# Patient Record
Sex: Female | Born: 1960 | Race: White | Hispanic: No | Marital: Single | Smoking: Never smoker
Health system: Southern US, Community
[De-identification: ages and names within clinical notes are randomized; demographics above are authoritative.]

## PROBLEM LIST (undated history)

## (undated) DIAGNOSIS — O99283 Endocrine, nutritional and metabolic diseases complicating pregnancy, third trimester: Secondary | ICD-10-CM

## (undated) DIAGNOSIS — N3090 Cystitis, unspecified without hematuria: Secondary | ICD-10-CM

## (undated) DIAGNOSIS — D529 Folate deficiency anemia, unspecified: Secondary | ICD-10-CM

## (undated) DIAGNOSIS — C73 Malignant neoplasm of thyroid gland: Secondary | ICD-10-CM

## (undated) DIAGNOSIS — C801 Malignant (primary) neoplasm, unspecified: Secondary | ICD-10-CM

## (undated) DIAGNOSIS — K219 Gastro-esophageal reflux disease without esophagitis: Secondary | ICD-10-CM

## (undated) DIAGNOSIS — E039 Hypothyroidism, unspecified: Secondary | ICD-10-CM

## (undated) HISTORY — DX: Cystitis, unspecified without hematuria: N30.90

## (undated) HISTORY — DX: Gastro-esophageal reflux disease without esophagitis: K21.9

## (undated) HISTORY — DX: Folate deficiency anemia, unspecified: D52.9

## (undated) HISTORY — DX: Hypocalcemia: E83.51

## (undated) HISTORY — PX: THYROIDECTOMY: SHX17

## (undated) NOTE — Telephone Encounter (Signed)
 Formatting of this note might be different from the original. Please refer to result note dated 03/24/15 Electronically signed by Rash, [name] A, LPN at 90/84/7983 10:53 AM EDT

## (undated) NOTE — Telephone Encounter (Signed)
 Formatting of this note might be different from the original. Patient left VM stating that she was returning [name]'s call about her lab results. She also stated that she thought Dr. Celena was going to put her on some blood pressure medicine, but that was not called in to her pharmacy. [name] CB# is [phone]. Electronically signed by Candiss Price T at 03/24/2015 10:39 AM EDT

---

## 1996-07-09 DIAGNOSIS — C73 Malignant neoplasm of thyroid gland: Secondary | ICD-10-CM

## 1996-07-09 HISTORY — DX: Malignant neoplasm of thyroid gland: C73

## 2015-01-19 ENCOUNTER — Encounter: Payer: Self-pay | Admitting: Emergency Medicine

## 2015-01-19 ENCOUNTER — Inpatient Hospital Stay
Admit: 2015-01-19 | Discharge: 2015-01-19 | Disposition: A | Discharge status: Home / Self Care | Payer: Self-pay | Attending: Radiation Oncology | Admitting: Radiation Oncology

## 2015-01-19 ENCOUNTER — Emergency Department: Payer: BLUE CROSS/BLUE SHIELD

## 2015-01-19 ENCOUNTER — Inpatient Hospital Stay
Admission: EM | Admit: 2015-01-19 | Discharge: 2015-01-27 | DRG: 987 | Disposition: A | Discharge status: Skilled Nursing Facility | Payer: BLUE CROSS/BLUE SHIELD | Attending: Internal Medicine | Admitting: Internal Medicine

## 2015-01-19 DIAGNOSIS — D638 Anemia in other chronic diseases classified elsewhere: Secondary | ICD-10-CM | POA: Diagnosis present

## 2015-01-19 DIAGNOSIS — Z681 Body mass index (BMI) 19 or less, adult: Secondary | ICD-10-CM | POA: Diagnosis not present

## 2015-01-19 DIAGNOSIS — Z79899 Other long term (current) drug therapy: Secondary | ICD-10-CM

## 2015-01-19 DIAGNOSIS — N63 Unspecified lump in breast: Secondary | ICD-10-CM | POA: Diagnosis not present

## 2015-01-19 DIAGNOSIS — R531 Weakness: Secondary | ICD-10-CM | POA: Diagnosis not present

## 2015-01-19 DIAGNOSIS — N3090 Cystitis, unspecified without hematuria: Secondary | ICD-10-CM

## 2015-01-19 DIAGNOSIS — C7951 Secondary malignant neoplasm of bone: Secondary | ICD-10-CM | POA: Diagnosis present

## 2015-01-19 DIAGNOSIS — E538 Deficiency of other specified B group vitamins: Secondary | ICD-10-CM | POA: Diagnosis present

## 2015-01-19 DIAGNOSIS — E039 Hypothyroidism, unspecified: Secondary | ICD-10-CM

## 2015-01-19 DIAGNOSIS — E43 Unspecified severe protein-calorie malnutrition: Secondary | ICD-10-CM | POA: Diagnosis present

## 2015-01-19 DIAGNOSIS — C73 Malignant neoplasm of thyroid gland: Secondary | ICD-10-CM | POA: Diagnosis present

## 2015-01-19 DIAGNOSIS — Z510 Encounter for antineoplastic radiation therapy: Secondary | ICD-10-CM | POA: Diagnosis not present

## 2015-01-19 DIAGNOSIS — C50919 Malignant neoplasm of unspecified site of unspecified female breast: Principal | ICD-10-CM | POA: Diagnosis present

## 2015-01-19 DIAGNOSIS — C50911 Malignant neoplasm of unspecified site of right female breast: Secondary | ICD-10-CM | POA: Diagnosis not present

## 2015-01-19 DIAGNOSIS — M899 Disorder of bone, unspecified: Secondary | ICD-10-CM

## 2015-01-19 DIAGNOSIS — Z807 Family history of other malignant neoplasms of lymphoid, hematopoietic and related tissues: Secondary | ICD-10-CM | POA: Diagnosis not present

## 2015-01-19 DIAGNOSIS — N6310 Unspecified lump in the right breast, unspecified quadrant: Secondary | ICD-10-CM | POA: Diagnosis present

## 2015-01-19 DIAGNOSIS — R52 Pain, unspecified: Secondary | ICD-10-CM | POA: Diagnosis not present

## 2015-01-19 DIAGNOSIS — M549 Dorsalgia, unspecified: Secondary | ICD-10-CM | POA: Diagnosis not present

## 2015-01-19 DIAGNOSIS — M898X5 Other specified disorders of bone, thigh: Secondary | ICD-10-CM | POA: Diagnosis present

## 2015-01-19 DIAGNOSIS — G9529 Other cord compression: Secondary | ICD-10-CM | POA: Diagnosis not present

## 2015-01-19 DIAGNOSIS — C9000 Multiple myeloma not having achieved remission: Secondary | ICD-10-CM | POA: Diagnosis present

## 2015-01-19 DIAGNOSIS — N390 Urinary tract infection, site not specified: Secondary | ICD-10-CM | POA: Diagnosis present

## 2015-01-19 HISTORY — DX: Hypothyroidism, unspecified: E03.9

## 2015-01-19 HISTORY — DX: Malignant neoplasm of thyroid gland: C73

## 2015-01-19 HISTORY — DX: Endocrine, nutritional and metabolic diseases complicating pregnancy, third trimester: O99.283

## 2015-01-19 HISTORY — DX: Malignant (primary) neoplasm, unspecified: C80.1

## 2015-01-19 LAB — COMPREHENSIVE METABOLIC PANEL
ALT: 33 U/L (ref 14–54)
AST: 59 U/L — AB (ref 15–41)
Albumin: 2.5 g/dL — ABNORMAL LOW (ref 3.5–5.0)
Alkaline Phosphatase: 1593 U/L — ABNORMAL HIGH (ref 38–126)
Anion gap: 10 (ref 5–15)
BUN: 13 mg/dL (ref 6–20)
CO2: 16 mmol/L — ABNORMAL LOW (ref 22–32)
CREATININE: 0.75 mg/dL (ref 0.44–1.00)
Calcium: 8.1 mg/dL — ABNORMAL LOW (ref 8.9–10.3)
Chloride: 110 mmol/L (ref 101–111)
GFR calc Af Amer: >60 mL/min (ref >60)
GFR calc non Af Amer: >60 mL/min (ref >60)
GLUCOSE: 204 mg/dL — AB (ref 65–99)
POTASSIUM: 3.5 mmol/L (ref 3.5–5.1)
SODIUM: 136 mmol/L (ref 135–145)
TOTAL PROTEIN: 6.1 g/dL — AB (ref 6.5–8.1)
Total Bilirubin: 0.4 mg/dL (ref 0.3–1.2)

## 2015-01-19 LAB — BLOOD GAS, VENOUS
Acid-base deficit: 2.8 mmol/L — ABNORMAL HIGH (ref 0.0–2.0)
BICARBONATE: 21.9 meq/L (ref 21.0–28.0)
Patient temperature: 37
pCO2, Ven: 37 mmHg — ABNORMAL LOW (ref 44.0–60.0)
pH, Ven: 7.38 (ref 7.320–7.430)

## 2015-01-19 LAB — CBC WITH DIFFERENTIAL/PLATELET
Basophils Absolute: 0.1 10*3/uL (ref 0–0.1)
Basophils Relative: 1 %
EOS PCT: 1 %
Eosinophils Absolute: 0.2 10*3/uL (ref 0–0.7)
HEMATOCRIT: 32.8 % — AB (ref 35.0–47.0)
HEMOGLOBIN: 10.6 g/dL — AB (ref 12.0–16.0)
Lymphocytes Relative: 33 %
Lymphs Abs: 4.4 10*3/uL — ABNORMAL HIGH (ref 1.0–3.6)
MCH: 32.2 pg (ref 26.0–34.0)
MCHC: 32.5 g/dL (ref 32.0–36.0)
MCV: 99.3 fL (ref 80.0–100.0)
MONO ABS: 1 10*3/uL — AB (ref 0.2–0.9)
Monocytes Relative: 7 %
NEUTROS PCT: 58 %
Neutro Abs: 7.9 10*3/uL — ABNORMAL HIGH (ref 1.4–6.5)
PLATELETS: 362 10*3/uL (ref 150–440)
RBC: 3.3 MIL/uL — ABNORMAL LOW (ref 3.80–5.20)
RDW: 21.6 % — AB (ref 11.5–14.5)
WBC: 13.5 10*3/uL — ABNORMAL HIGH (ref 3.6–11.0)

## 2015-01-19 LAB — URINALYSIS COMPLETE WITH MICROSCOPIC (ARMC ONLY)
Bilirubin Urine: NEGATIVE
GLUCOSE, UA: NEGATIVE mg/dL
Ketones, ur: NEGATIVE mg/dL
NITRITE: NEGATIVE
PH: 6 (ref 5.0–8.0)
PROTEIN: NEGATIVE mg/dL
Specific Gravity, Urine: 1.005 (ref 1.005–1.030)

## 2015-01-19 LAB — T4, FREE: FREE T4: 0.35 ng/dL — AB (ref 0.61–1.12)

## 2015-01-19 LAB — CK: Total CK: 49 U/L (ref 38–234)

## 2015-01-19 MED ORDER — DIAZEPAM 5 MG PO TABS
ORAL_TABLET | ORAL | Status: AC
  Administered 2015-01-19: 10 mg via ORAL
  Filled 2015-01-19: qty 2

## 2015-01-19 MED ORDER — DIAZEPAM 5 MG PO TABS
10.0000 mg | ORAL_TABLET | Freq: Once | ORAL | Status: AC
  Administered 2015-01-19: 10 mg via ORAL

## 2015-01-19 MED ORDER — MAGNESIUM HYDROXIDE 400 MG/5ML PO SUSP
30.0000 mL | Freq: Every day | ORAL | Status: DC | PRN
  Administered 2015-01-23: 30 mL via ORAL
  Filled 2015-01-19: qty 30

## 2015-01-19 MED ORDER — SODIUM CHLORIDE 0.9 % IV SOLN
INTRAVENOUS | Status: DC
  Administered 2015-01-20 – 2015-01-23 (×6): via INTRAVENOUS

## 2015-01-19 MED ORDER — ONDANSETRON HCL 4 MG/2ML IJ SOLN: 4.0000 mg | Freq: Four times a day (QID) | INTRAMUSCULAR | Status: DC | PRN

## 2015-01-19 MED ORDER — OXYCODONE HCL 5 MG PO TABS
5.0000 mg | ORAL_TABLET | ORAL | Status: DC | PRN
Start: 2015-01-19 — End: 2015-01-27
  Administered 2015-01-23 – 2015-01-26 (×7): 5 mg via ORAL
  Filled 2015-01-19 (×8): qty 1

## 2015-01-19 MED ORDER — IOHEXOL 240 MG/ML SOLN
50.0000 mL | Freq: Once | INTRAMUSCULAR | Status: AC | PRN
  Administered 2015-01-19: 50 mL via ORAL
  Filled 2015-01-19: qty 50

## 2015-01-19 MED ORDER — IOHEXOL 300 MG/ML  SOLN
80.0000 mL | Freq: Once | INTRAMUSCULAR | Status: AC | PRN
  Administered 2015-01-19: 80 mL via INTRAVENOUS
  Filled 2015-01-19: qty 80

## 2015-01-19 MED ORDER — ACETAMINOPHEN 325 MG PO TABS: 650.0000 mg | ORAL_TABLET | Freq: Four times a day (QID) | ORAL | Status: DC | PRN

## 2015-01-19 MED ORDER — CEFTRIAXONE SODIUM IN DEXTROSE 20 MG/ML IV SOLN
1.0000 g | Freq: Once | INTRAVENOUS | Status: AC
  Administered 2015-01-19: 1 g via INTRAVENOUS
  Filled 2015-01-19: qty 50

## 2015-01-19 MED ORDER — SODIUM CHLORIDE 0.9 % IV SOLN
1.0000 g | Freq: Once | INTRAVENOUS | Status: AC
  Administered 2015-01-19: 1 g via INTRAVENOUS
  Filled 2015-01-19 (×2): qty 10

## 2015-01-19 MED ORDER — HEPARIN SODIUM (PORCINE) 5000 UNIT/ML IJ SOLN
5000.0000 [IU] | Freq: Three times a day (TID) | INTRAMUSCULAR | Status: DC
Start: 2015-01-19 — End: 2015-01-20
  Administered 2015-01-20: 01:00:00 5000 [IU] via SUBCUTANEOUS
  Filled 2015-01-19: qty 1

## 2015-01-19 MED ORDER — ONDANSETRON HCL 4 MG PO TABS: 4.0000 mg | ORAL_TABLET | Freq: Four times a day (QID) | ORAL | Status: DC | PRN

## 2015-01-19 MED ORDER — ACETAMINOPHEN 650 MG RE SUPP: 650.0000 mg | Freq: Four times a day (QID) | RECTAL | Status: DC | PRN

## 2015-01-19 MED ORDER — MORPHINE SULFATE 2 MG/ML IJ SOLN
2.0000 mg | INTRAMUSCULAR | Status: DC | PRN
  Filled 2015-01-19: qty 1

## 2015-01-19 NOTE — ED Provider Notes (Signed)
[facility] Emergency Department Provider Note     Time seen: ----------------------------------------- 5:39 PM on 01/19/2015 -----------------------------------------    I have reviewed the triage vital signs and the nursing notes.   HISTORY  Chief Complaint Weakness    HPI Denise Kaufman is a 54 y.o. female who presents ER for muscle weakness shakiness and generalized pain for last 8 weeks. Patient states she Massaged about 2 Weeks Ago When She Was in Pain and Weakness Has Increased since That Period Time Patient States She Feels like There Is A Lot Of a Flowing and Pooling in Her Connective Tissues. Patient Reports She's Been Hyperventilating Some Dry Mouth and 6 Episodes. Patient States She Can't Walk Due To the Weakness Currently. Denies Fevers Chills or Other Complaints.   Past Medical History  Diagnosis Date  . Hypothyroidism complicating pregnancy in third trimester   . Cancer   . Thyroid cancer     There are no active problems to display for this patient.   Past Surgical History  Procedure Laterality Date  . Thyroidectomy      Allergies Review of patient's allergies indicates no known allergies.  Social History History  Substance Use Topics  . Smoking status: Never Smoker   . Smokeless tobacco: Not on file  . Alcohol Use: No    Review of Systems Constitutional: Negative for fever. Eyes: Negative for visual changes. ENT: Negative for sore throat. Cardiovascular: Negative for chest pain. Respiratory: Negative for shortness of breath. Gastrointestinal: Negative for abdominal pain, vomiting and diarrhea. Genitourinary: Negative for dysuria. Musculoskeletal: Positive for back pain and spasms. Skin: Negative for rash. Neurological: Positive for generalized weakness  10-point ROS otherwise negative.  ____________________________________________   PHYSICAL EXAM:  VITAL SIGNS: ED Triage Vitals  Enc Vitals Group     BP  01/19/15 1648 134/101 mmHg     Pulse Rate 01/19/15 1648 140     Resp 01/19/15 1648 16     Temp 01/19/15 1648 98.1 F (36.7 C)     Temp Source 01/19/15 1648 Oral     SpO2 01/19/15 1648 95 %     Weight 01/19/15 1648 95 lb (43.092 kg)     Height 01/19/15 1648 5' 3"  (1.6 m)     Head Cir --      Peak Flow --      Pain Score 01/19/15 1649 10     Pain Loc --      Pain Edu? --      Excl. in [place]? --     Constitutional: Alert and oriented. Anxious, well appearing and in no distress. Eyes: Conjunctivae are normal. PERRL. Normal extraocular movements. ENT   Head: Normocephalic and atraumatic.   Nose: No congestion/rhinnorhea.   Mouth/Throat: Mucous membranes are moist.   Neck: No stridor. Cardiovascular: Normal rate, regular rhythm. Normal and symmetric distal pulses are present in all extremities. No murmurs, rubs, or gallops. Respiratory: Normal respiratory effort without tachypnea nor retractions. Breath sounds are clear and equal bilaterally. No wheezes/rales/rhonchi. Gastrointestinal: Soft and nontender. No distention. No abdominal bruits. There is no CVA tenderness. Musculoskeletal: Nontender with normal range of motion in all extremities. No joint effusions.  No lower extremity tenderness nor edema. Neurologic:  No gross focal neurologic deficits are appreciated. Generalized weakness, nothing focal. Speech is normal.  Skin:  Pallor, no obvious rashes appreciated Psychiatric: Mood and affect are normal. Speech and behavior is abnormal. Patient appears anxious at this time ____________________________________________  EKG: Interpreted by me. Sinus tachycardia with a  rate of 126 bpm, PR interval was normal, QRS width is normal, QT interval is prolonged. No evidence of acute infarction. There is some suggestion of ST depression inferiorly.  Radiology:  FINDINGS: Cardiac shadow is enlarged. The lungs are well aerated bilaterally. There is soft tissue density along the lateral  aspect of the right lung apex with apparent loss of the second and third ribs laterally on the right. There are changes consistent with displaced fracture of the right sixth rib as well as apparent lucent lesion within the lateral aspect of the left left sixth rib right basilar atelectasis is noted. No sizable effusion is seen.  IMPRESSION: Changes suggestive of lytic lesions throughout multiple ribs as well as an apical soft tissue mass on the right.  Right basilar atelectasis.  CT evaluation of the chest is recommended for further evaluation.  FINDINGS: CT CHEST FINDINGS  The lungs are well aerated bilaterally. A calcified granuloma is noted within the right middle lobe. Some segmental atelectasis is noted within the lungs bilaterally particularly in the lingula as well as the right lower lobe. No focal confluent infiltrate is seen. No sizable effusion is noted.  The bony structures demonstrate involvement of the sternum, multiple ribs as well as the thoracic spine with expansile predominately lytic lesions. Some isolated lesions are identified within the vertebral bodies which would suggest myelomatous involvement. Multiple compression deformities are identified within the thoracic spine to include T6, T5 and C7. Although these lesions could represent treated disease the likely diagnosis is myelomatous involvement given the diffuse involvement of the rib cage spine and appendicular skeleton. Multiple areas of central canal stenosis are noted related to the underlying disease process.  In the medial aspect of the right breast there is a 2.6 x 1.8 cm soft tissue lesion which has irregular borders and may represent a breast mass. Further evaluation is recommended.  CT ABDOMEN AND PELVIS FINDINGS  The liver, gallbladder, spleen, adrenal glands and pancreas are within normal limits. The kidneys are well visualized bilaterally without renal calculi or focal mass lesion.  The  appendix is not well visualized although no inflammatory changes are identified. The bladder is well distended. A cystic lesion is in the region of the right ovary which measures approximately 7.6 cm in greatest dimension. It has some mural calcifications but otherwise has benign characteristics. The uterus demonstrates an area of decreased attenuation best seen on image number 82 of series 2 likely representing a uterine fibroid.  Diffuse bony involvement is noted similar to that seen in the chest with expansile predominantly lytic lesions highly suggestive of myelomatous involvement. Diffuse spinal involvement is noted with multilevel areas of narrowing. Compression deformities are noted within the lumbar spine with a vertebral plana identified at L5.  IMPRESSION: Diffuse bony involvement as described. These changes suggest mild lumbar this involvement and percutaneous biopsy of an accessible lesion is recommended. A right iliac wing lesion may be the most accessible lesion.  Irregular mass lesion in the medial aspect of the right breast. Further workup is recommended to exclude neoplasm.  Bibasilar atelectatic changes are noted.  Likely benign ovarian cystic lesion.  Hypodensity within the uterus likely representing a uterine fibroid. ____________________________________________  ED COURSE:  Pertinent labs & imaging results that were available during my care of the patient were reviewed by me and considered in my medical decision making (see chart for details). Patient with nonspecific symptoms, appears very anxious and jittery. We'll give by mouth Valium and basic labs.  Patient with  some markedly abnormal abnormalities as well as abnormal chest x-ray. We'll get CT scans and reevaluate. There is some concern for cancer in this patient ____________________________________________    LABS (pertinent positives/negatives)  Labs Reviewed  BLOOD GAS, VENOUS - Abnormal; Notable  for the following:    pCO2, Ven 37 (*)    Acid-base deficit 2.8 (*)    All other components within normal limits  CBC WITH DIFFERENTIAL/PLATELET - Abnormal; Notable for the following:    WBC 13.5 (*)    RBC 3.30 (*)    Hemoglobin 10.6 (*)    HCT 32.8 (*)    RDW 21.6 (*)    Neutro Abs 7.9 (*)    Lymphs Abs 4.4 (*)    Monocytes Absolute 1.0 (*)    All other components within normal limits  COMPREHENSIVE METABOLIC PANEL - Abnormal; Notable for the following:    CO2 16 (*)    Glucose, Bld 204 (*)    Calcium 8.1 (*)    Total Protein 6.1 (*)    Albumin 2.5 (*)    AST 59 (*)    Alkaline Phosphatase 1593 (*)    All other components within normal limits  URINALYSIS COMPLETEWITH MICROSCOPIC ([facility] ONLY) - Abnormal; Notable for the following:    Color, Urine YELLOW (*)    APPearance CLEAR (*)    Hgb urine dipstick 2+ (*)    Leukocytes, UA 3+ (*)    Bacteria, UA RARE (*)    Squamous Epithelial / LPF 0-5 (*)    All other components within normal limits  T4, FREE - Abnormal; Notable for the following:    Free T4 0.35 (*)    All other components within normal limits  CK   CRITICAL CARE Performed by: Earleen Newport   Total critical care time: 30 minutes  Critical care time was exclusive of separately billable procedures and treating other patients.  Critical care was necessary to treat or prevent imminent or life-threatening deterioration.  Critical care was time spent personally by me on the following activities: development of treatment plan with patient and/or surrogate as well as nursing, discussions with consultants, evaluation of patient's response to treatment, examination of patient, obtaining history from patient or surrogate, ordering and performing treatments and interventions, ordering and review of laboratory studies, ordering and review of radiographic studies, pulse oximetry and re-evaluation of patient's  condition.  ____________________________________________  FINAL ASSESSMENT AND PLAN  Weakness, hypocalcemia, myelomatous disease, cystitis  Plan: Patient with labs as dictated above. Extensive bony distraction is visible on all imaging from CT head to pelvis. Suspect underlying multiple myeloma. Patient also has multiple other health issues as well as hypocalcemia, hypothyroidism, UTI. Discussed with oncology who will see the patient the morning. Patient will be admitted by the hospitalists here. She's been given IV calcium as well as IV Rocephin for urinary tract infection. Patient will also need supplemental thyroid medication.   Earleen Newport, MD   Earleen Newport, MD 01/19/15 [phone]

## 2015-01-19 NOTE — ED Notes (Signed)
Spoke with Dr Jimmye Norman about pt's c/o. MD asking to see patient.

## 2015-01-19 NOTE — ED Notes (Signed)
Patient transported to CT 

## 2015-01-19 NOTE — H&P (Signed)
[facility] [place] NAME: Denise Kaufman    MR#:  [id number]  DATE OF BIRTH:  05-28-1961   DATE OF ADMISSION:  01/19/2015  PRIMARY CARE PHYSICIAN: No primary care provider on file. none  REQUESTING/REFERRING PHYSICIAN: [place]  CHIEF COMPLAINT:   Chief Complaint  Patient presents with  . Weakness    HISTORY OF PRESENT ILLNESS:  Denise Kaufman  is a 54 y.o. 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 She describes progressively worsening symptoms over the last 8 weeks with generalized weakness and back pain. However these have been exacerbated and progressively worsening the last 2 week duration after massage. She states that her weakness is most noted on the left side including upper and lower extremity where she states "it feels heavy like there is a gravitational pull". She also describes her back pain mid back as well as lower back worse with movement particularly standing described only as "pain" intensity 6-7 out of 10 nonradiating. She also describes generalized symptoms stating that "I feel like I'm having acidosis attack" and "I feel if there is battery acid or some toxin trying to leave my body"  Emergency department course: Found to have multiple lytic lesions on imaging  PAST MEDICAL HISTORY:   Past Medical History  Diagnosis Date  . Cancer   . Hypothyroidism complicating pregnancy in third trimester   . Thyroid cancer     Papillary thyroid cancer    PAST SURGICAL HISTORY:   Past Surgical History  Procedure Laterality Date  . Thyroidectomy      SOCIAL HISTORY:   History  Substance Use Topics  . Smoking status: Never Smoker   . Smokeless tobacco: Not on file  . Alcohol Use: No    FAMILY HISTORY:   Family History  Problem Relation Age of Onset  . Non-Hodgkin's lymphoma Father     DRUG ALLERGIES:  No  Known Allergies  REVIEW OF SYSTEMS:  REVIEW OF SYSTEMS:  CONSTITUTIONAL: Denies fevers, chills, positive fatigue, weakness. Positive for weight loss (unknown amount) EYES: Denies blurred vision, double vision, or eye pain.  EARS, NOSE, THROAT: Denies tinnitus, ear pain, hearing loss.  RESPIRATORY: denies cough, shortness of breath, wheezing  CARDIOVASCULAR: Denies chest pain, palpitations, edema.  GASTROINTESTINAL: Denies nausea, vomiting, diarrhea, abdominal pain.  GENITOURINARY: Denies dysuria, hematuria.  ENDOCRINE: Denies nocturia or thyroid problems. HEMATOLOGIC AND LYMPHATIC: Denies easy bruising or bleeding.  SKIN: Denies rash or lesions.  MUSCULOSKELETAL: Positive for pain in back, hip denies shoulder, neck, knee pain or further arthritic symptoms NEUROLOGIC: Denies paralysis, positive paresthesias/burning sensation mainly lower extremities PSYCHIATRIC: Denies anxiety or depressive symptoms. Otherwise full review of systems performed by me is negative.   MEDICATIONS AT HOME:   Prior to Admission medications   Not on File      VITAL SIGNS:  Blood pressure 125/80, pulse 123, temperature 98 F (36.7 C), temperature source Oral, resp. rate 18, height 5' 3"  (1.6 m), weight 95 lb (43.092 kg), last menstrual period 11/15/2014, SpO2 94 %.  PHYSICAL EXAMINATION:  VITAL SIGNS: Filed Vitals:   01/19/15 2315  BP: 125/80  Pulse: 123  Temp: 98 F (36.7 C)  Resp: 18   GENERAL:54 y.o.female currently in no acute distress. Weak appearing HEAD: Normocephalic, atraumatic.  EYES: Pupils equal, round, reactive to light. Extraocular muscles intact. No scleral icterus.  MOUTH: Moist mucosal membrane. Dentition intact. No abscess  noted.  EAR, NOSE, THROAT: Clear without exudates. No external lesions.  NECK: Supple. No thyromegaly. No nodules. No JVD.  PULMONARY: Clear to ascultation, without wheeze rails or rhonci. No use of accessory muscles, Good respiratory effort. good air entry  bilaterally CHEST: Nontender to palpation.  CARDIOVASCULAR: S1 and S2. Regular rate and rhythm. No murmurs, rubs, or gallops. No edema. Pedal pulses 2+ bilaterally.  GASTROINTESTINAL: Soft, nontender, nondistended. No masses. Positive bowel sounds. No hepatosplenomegaly.  MUSCULOSKELETAL: No swelling, clubbing, or edema. Range of motion full in all extremities.  NEUROLOGIC: Cranial nerves II through XII are intact. Sensation intact. Reflexes intact. Within normal limits, strength 5/5 upper and lower extremities (right-sided) including proximal/distal flexion/extension-left side strength 4/5 throughout upper and lower flexion and extension SKIN: No ulceration, lesions, rashes, or cyanosis. Skin warm and dry. Turgor intact.  PSYCHIATRIC: Mood, affect within normal limits. The patient is awake, alert and oriented x 3. Insight, judgment intact.    LABORATORY PANEL:   CBC  Recent Labs Lab 01/19/15 1641  WBC 13.5*  HGB 10.6*  HCT 32.8*  PLT 362   ------------------------------------------------------------------------------------------------------------------  Chemistries   Recent Labs Lab 01/19/15 1641  NA 136  K 3.5  CL 110  CO2 16*  GLUCOSE 204*  BUN 13  CREATININE 0.75  CALCIUM 8.1*  AST 59*  ALT 33  ALKPHOS 1593*  BILITOT 0.4   ------------------------------------------------------------------------------------------------------------------  Cardiac Enzymes No results for input(s): TROPONINI in the last 168 hours. ------------------------------------------------------------------------------------------------------------------  RADIOLOGY:  Dg Chest 2 View  01/19/2015   CLINICAL DATA:  Muscle weakness and pain for multiple weeks  EXAM: CHEST - 2 VIEW  COMPARISON:  None.  FINDINGS: Cardiac shadow is enlarged. The lungs are well aerated bilaterally. There is soft tissue density along the lateral aspect of the right lung apex with apparent loss of the second and third  ribs laterally on the right. There are changes consistent with displaced fracture of the right sixth rib as well as apparent lucent lesion within the lateral aspect of the left left sixth rib right basilar atelectasis is noted. No sizable effusion is seen.  IMPRESSION: Changes suggestive of lytic lesions throughout multiple ribs as well as an apical soft tissue mass on the right.  Right basilar atelectasis.  CT evaluation of the chest is recommended for further evaluation.   Electronically Signed   By: Inez Catalina M.D.   On: 01/19/2015 18:43   Ct Head Wo Contrast  01/19/2015   CLINICAL DATA:  Presenting with muscle weakness and shaking, 8 weeks duration.  EXAM: CT HEAD WITHOUT CONTRAST  TECHNIQUE: Contiguous axial images were obtained from the base of the skull through the vertex without intravenous contrast.  COMPARISON:  None.  FINDINGS: The brain has a normal appearance without evidence of atrophy, infarction, mass lesion, hemorrhage, hydrocephalus or extra-axial collection. The calvarium is unremarkable. The paranasal sinuses, middle ears and mastoids are clear.  IMPRESSION: Normal head CT   Electronically Signed   By: Nelson Chimes M.D.   On: 01/19/2015 20:56   Ct Chest W Contrast  01/19/2015   CLINICAL DATA:  Weakness and pain for 2 months, known history of treated thyroid carcinoma  EXAM: CT CHEST, ABDOMEN, AND PELVIS WITH CONTRAST  TECHNIQUE: Multidetector CT imaging of the chest, abdomen and pelvis was performed following the standard protocol during bolus administration of intravenous contrast.  CONTRAST:  53m OMNIPAQUE IOHEXOL 300 MG/ML  SOLN  COMPARISON:  Chest x-ray from earlier in the same day  FINDINGS: CT CHEST  FINDINGS  The lungs are well aerated bilaterally. A calcified granuloma is noted within the right middle lobe. Some segmental atelectasis is noted within the lungs bilaterally particularly in the lingula as well as the right lower lobe. No focal confluent infiltrate is seen. No sizable  effusion is noted.  The bony structures demonstrate involvement of the sternum, multiple ribs as well as the thoracic spine with expansile predominately lytic lesions. Some isolated lesions are identified within the vertebral bodies which would suggest myelomatous involvement. Multiple compression deformities are identified within the thoracic spine to include T6, T5 and C7. Although these lesions could represent treated disease the likely diagnosis is myelomatous involvement given the diffuse involvement of the rib cage spine and appendicular skeleton. Multiple areas of central canal stenosis are noted related to the underlying disease process.  In the medial aspect of the right breast there is a 2.6 x 1.8 cm soft tissue lesion which has irregular borders and may represent a breast mass. Further evaluation is recommended.  CT ABDOMEN AND PELVIS FINDINGS  The liver, gallbladder, spleen, adrenal glands and pancreas are within normal limits. The kidneys are well visualized bilaterally without renal calculi or focal mass lesion.  The appendix is not well visualized although no inflammatory changes are identified. The bladder is well distended. A cystic lesion is in the region of the right ovary which measures approximately 7.6 cm in greatest dimension. It has some mural calcifications but otherwise has benign characteristics. The uterus demonstrates an area of decreased attenuation best seen on image number 82 of series 2 likely representing a uterine fibroid.  Diffuse bony involvement is noted similar to that seen in the chest with expansile predominantly lytic lesions highly suggestive of myelomatous involvement. Diffuse spinal involvement is noted with multilevel areas of narrowing. Compression deformities are noted within the lumbar spine with a vertebral plana identified at L5.  IMPRESSION: Diffuse bony involvement as described. These changes suggest mild lumbar this involvement and percutaneous biopsy of an  accessible lesion is recommended. A right iliac wing lesion may be the most accessible lesion.  Irregular mass lesion in the medial aspect of the right breast. Further workup is recommended to exclude neoplasm.  Bibasilar atelectatic changes are noted.  Likely benign ovarian cystic lesion.  Hypodensity within the uterus likely representing a uterine fibroid.   Electronically Signed   By: Inez Catalina M.D.   On: 01/19/2015 21:21   Ct Abdomen Pelvis W Contrast  01/19/2015   CLINICAL DATA:  Weakness and pain for 2 months, known history of treated thyroid carcinoma  EXAM: CT CHEST, ABDOMEN, AND PELVIS WITH CONTRAST  TECHNIQUE: Multidetector CT imaging of the chest, abdomen and pelvis was performed following the standard protocol during bolus administration of intravenous contrast.  CONTRAST:  71m OMNIPAQUE IOHEXOL 300 MG/ML  SOLN  COMPARISON:  Chest x-ray from earlier in the same day  FINDINGS: CT CHEST FINDINGS  The lungs are well aerated bilaterally. A calcified granuloma is noted within the right middle lobe. Some segmental atelectasis is noted within the lungs bilaterally particularly in the lingula as well as the right lower lobe. No focal confluent infiltrate is seen. No sizable effusion is noted.  The bony structures demonstrate involvement of the sternum, multiple ribs as well as the thoracic spine with expansile predominately lytic lesions. Some isolated lesions are identified within the vertebral bodies which would suggest myelomatous involvement. Multiple compression deformities are identified within the thoracic spine to include T6, T5 and C7. Although these lesions  could represent treated disease the likely diagnosis is myelomatous involvement given the diffuse involvement of the rib cage spine and appendicular skeleton. Multiple areas of central canal stenosis are noted related to the underlying disease process.  In the medial aspect of the right breast there is a 2.6 x 1.8 cm soft tissue lesion which  has irregular borders and may represent a breast mass. Further evaluation is recommended.  CT ABDOMEN AND PELVIS FINDINGS  The liver, gallbladder, spleen, adrenal glands and pancreas are within normal limits. The kidneys are well visualized bilaterally without renal calculi or focal mass lesion.  The appendix is not well visualized although no inflammatory changes are identified. The bladder is well distended. A cystic lesion is in the region of the right ovary which measures approximately 7.6 cm in greatest dimension. It has some mural calcifications but otherwise has benign characteristics. The uterus demonstrates an area of decreased attenuation best seen on image number 82 of series 2 likely representing a uterine fibroid.  Diffuse bony involvement is noted similar to that seen in the chest with expansile predominantly lytic lesions highly suggestive of myelomatous involvement. Diffuse spinal involvement is noted with multilevel areas of narrowing. Compression deformities are noted within the lumbar spine with a vertebral plana identified at L5.  IMPRESSION: Diffuse bony involvement as described. These changes suggest mild lumbar this involvement and percutaneous biopsy of an accessible lesion is recommended. A right iliac wing lesion may be the most accessible lesion.  Irregular mass lesion in the medial aspect of the right breast. Further workup is recommended to exclude neoplasm.  Bibasilar atelectatic changes are noted.  Likely benign ovarian cystic lesion.  Hypodensity within the uterus likely representing a uterine fibroid.   Electronically Signed   By: Inez Catalina M.D.   On: 01/19/2015 21:21    EKG:   Orders placed or performed during the hospital encounter of 01/19/15  . ED EKG  . ED EKG    IMPRESSION AND PLAN:   54 year old Caucasian female history of papillary thyroid cancer status post thyroidectomy presenting with weakness and back pain. She does not follow with PCP and has not had a  routine cancer maintenance a number of years.  1. Multiple lytic bone lesions: Given symptoms of bone pain with lytic lesions and elevated alkaline phosphatase concern for cancerous process including multiple myeloma, breast cancer, various other etiologies. Noted thoracic compression fractures: Provide pain medications as required. Consult oncology, check LDH, beta-2 microglobulin, C-reactive protein, SPEP, UPEP, vitamin D, PTH, PTH related peptide as an initial workup she'll need further workup including likely bone biopsy. Of note she did receive IV calcium supplementation in the emergency department by ED staff however corrected calcium relation to her hypoalbuminemia was within normal limits.  2. UTI unspecified site: Ceftriaxone 3. Venous thromboembolism prophylactic: Heparin subcutaneous     All the records are reviewed and case discussed with ED provider. Management plans discussed with the patient, family and they are in agreement.  CODE STATUS: Full  TOTAL TIME TAKING CARE OF THIS PATIENT: 65 minutes.    Lacreshia Bondarenko,  Karenann Cai.D on 01/19/2015 at 11:35 PM  Between 7am to 6pm - Pager - [phone]  After 6pm: House Pager: - [phone]  [facility]  Office  [phone]  CC: Primary care physician; No primary care provider on file.

## 2015-01-19 NOTE — ED Notes (Signed)
Pt here with c/o muscle weakness, shakiness, and pain x8 weeks. Pt reports she got a massage 2 weeks ago, pain and weakness has increased since. Pt states "I feel like lava is flowing and pooling in my connective tissues". Pt reports she's been hyperventilating, dry mouth and syncopal episodes. Pt alert and oriented in triage room. Pt states she's unable to walk at this time due to weakness.

## 2015-01-19 NOTE — ED Notes (Signed)
Lab notified of add on for t4

## 2015-01-20 ENCOUNTER — Ambulatory Visit: Payer: BLUE CROSS/BLUE SHIELD | Admitting: Radiation Oncology

## 2015-01-20 ENCOUNTER — Institutional Professional Consult (permissible substitution): Payer: BLUE CROSS/BLUE SHIELD | Admitting: Radiation Oncology

## 2015-01-20 ENCOUNTER — Other Ambulatory Visit: Payer: Self-pay | Admitting: Radiation Oncology

## 2015-01-20 DIAGNOSIS — R748 Abnormal levels of other serum enzymes: Secondary | ICD-10-CM

## 2015-01-20 DIAGNOSIS — E890 Postprocedural hypothyroidism: Secondary | ICD-10-CM

## 2015-01-20 DIAGNOSIS — N63 Unspecified lump in breast: Secondary | ICD-10-CM

## 2015-01-20 DIAGNOSIS — R531 Weakness: Secondary | ICD-10-CM

## 2015-01-20 DIAGNOSIS — E43 Unspecified severe protein-calorie malnutrition: Secondary | ICD-10-CM | POA: Diagnosis present

## 2015-01-20 DIAGNOSIS — M549 Dorsalgia, unspecified: Secondary | ICD-10-CM

## 2015-01-20 DIAGNOSIS — R634 Abnormal weight loss: Secondary | ICD-10-CM

## 2015-01-20 DIAGNOSIS — Z85850 Personal history of malignant neoplasm of thyroid: Secondary | ICD-10-CM

## 2015-01-20 DIAGNOSIS — N6310 Unspecified lump in the right breast, unspecified quadrant: Secondary | ICD-10-CM | POA: Diagnosis present

## 2015-01-20 DIAGNOSIS — Z809 Family history of malignant neoplasm, unspecified: Secondary | ICD-10-CM

## 2015-01-20 DIAGNOSIS — R52 Pain, unspecified: Secondary | ICD-10-CM

## 2015-01-20 DIAGNOSIS — R7989 Other specified abnormal findings of blood chemistry: Secondary | ICD-10-CM

## 2015-01-20 DIAGNOSIS — N390 Urinary tract infection, site not specified: Secondary | ICD-10-CM

## 2015-01-20 DIAGNOSIS — M899 Disorder of bone, unspecified: Secondary | ICD-10-CM

## 2015-01-20 DIAGNOSIS — G9529 Other cord compression: Secondary | ICD-10-CM

## 2015-01-20 DIAGNOSIS — R5383 Other fatigue: Secondary | ICD-10-CM

## 2015-01-20 DIAGNOSIS — R140 Abdominal distension (gaseous): Secondary | ICD-10-CM

## 2015-01-20 DIAGNOSIS — D649 Anemia, unspecified: Secondary | ICD-10-CM

## 2015-01-20 DIAGNOSIS — C7951 Secondary malignant neoplasm of bone: Secondary | ICD-10-CM | POA: Diagnosis present

## 2015-01-20 LAB — CBC
HCT: 30 % — ABNORMAL LOW (ref 35.0–47.0)
Hemoglobin: 9.8 g/dL — ABNORMAL LOW (ref 12.0–16.0)
MCH: 32.1 pg (ref 26.0–34.0)
MCHC: 32.5 g/dL (ref 32.0–36.0)
MCV: 98.7 fL (ref 80.0–100.0)
PLATELETS: 302 10*3/uL (ref 150–440)
RBC: 3.04 MIL/uL — ABNORMAL LOW (ref 3.80–5.20)
RDW: 20.5 % — ABNORMAL HIGH (ref 11.5–14.5)
WBC: 11.4 10*3/uL — ABNORMAL HIGH (ref 3.6–11.0)

## 2015-01-20 LAB — COMPREHENSIVE METABOLIC PANEL
ALBUMIN: 2.3 g/dL — AB (ref 3.5–5.0)
ALK PHOS: 1534 U/L — AB (ref 38–126)
ALT: 31 U/L (ref 14–54)
ANION GAP: 9 (ref 5–15)
AST: 54 U/L — ABNORMAL HIGH (ref 15–41)
BUN: 11 mg/dL (ref 6–20)
CHLORIDE: 109 mmol/L (ref 101–111)
CO2: 21 mmol/L — ABNORMAL LOW (ref 22–32)
CREATININE: 0.64 mg/dL (ref 0.44–1.00)
Calcium: 8.3 mg/dL — ABNORMAL LOW (ref 8.9–10.3)
GFR calc non Af Amer: >60 mL/min (ref >60)
Glucose, Bld: 95 mg/dL (ref 65–99)
Potassium: 4.1 mmol/L (ref 3.5–5.1)
SODIUM: 139 mmol/L (ref 135–145)
Total Bilirubin: 0.5 mg/dL (ref 0.3–1.2)
Total Protein: 5.5 g/dL — ABNORMAL LOW (ref 6.5–8.1)

## 2015-01-20 LAB — APTT: aPTT: 54 seconds — ABNORMAL HIGH (ref 24–36)

## 2015-01-20 LAB — TSH: TSH: 2.108 u[IU]/mL (ref 0.350–4.500)

## 2015-01-20 LAB — LACTATE DEHYDROGENASE: LDH: 315 U/L — ABNORMAL HIGH (ref 98–192)

## 2015-01-20 LAB — UIFE/LIGHT CHAINS/TP QN, 24-HR UR

## 2015-01-20 MED ORDER — MORPHINE SULFATE 2 MG/ML IJ SOLN
2.0000 mg | INTRAMUSCULAR | Status: DC | PRN
  Administered 2015-01-21: 13:00:00 2 mg via INTRAVENOUS

## 2015-01-20 MED ORDER — DEXAMETHASONE 4 MG PO TABS
4.0000 mg | ORAL_TABLET | Freq: Four times a day (QID) | ORAL | Status: DC
  Administered 2015-01-20 – 2015-01-27 (×27): 4 mg via ORAL
  Filled 2015-01-20 (×28): qty 1

## 2015-01-20 MED ORDER — HEPARIN SODIUM (PORCINE) 5000 UNIT/ML IJ SOLN
5000.0000 [IU] | Freq: Three times a day (TID) | INTRAMUSCULAR | Status: DC
  Administered 2015-01-20 – 2015-01-27 (×20): 5000 [IU] via SUBCUTANEOUS
  Filled 2015-01-20 (×20): qty 1

## 2015-01-20 MED ORDER — CEFTRIAXONE SODIUM IN DEXTROSE 20 MG/ML IV SOLN
1.0000 g | INTRAVENOUS | Status: DC
  Filled 2015-01-20: qty 50

## 2015-01-20 MED ORDER — CEFTRIAXONE SODIUM IN DEXTROSE 20 MG/ML IV SOLN
1.0000 g | INTRAVENOUS | Status: DC
  Administered 2015-01-20: 1 g via INTRAVENOUS
  Filled 2015-01-20 (×2): qty 50

## 2015-01-20 MED ORDER — ZOLPIDEM TARTRATE 5 MG PO TABS
5.0000 mg | ORAL_TABLET | Freq: Every evening | ORAL | Status: DC | PRN
  Administered 2015-01-20 – 2015-01-26 (×8): 5 mg via ORAL
  Filled 2015-01-20 (×9): qty 1

## 2015-01-20 MED ORDER — LEVOTHYROXINE SODIUM 112 MCG PO TABS
112.0000 ug | ORAL_TABLET | Freq: Every day | ORAL | Status: DC
  Administered 2015-01-20 – 2015-01-27 (×8): 112 ug via ORAL
  Filled 2015-01-20 (×9): qty 1

## 2015-01-20 NOTE — Progress Notes (Signed)
Notified Dr Marcille Blanco that pt requested medication for insomnia. MD to place order.

## 2015-01-20 NOTE — Consult Note (Signed)
[facility]  Date of admission:  01/19/2015  Inpatient day:  01/20/2015  Consulting physician: Dr. Valentino Nose   Chief Complaint: Denise Kaufman is a 54 y.o. female with a history of papillary thyroid carcinoma who was admitted through the emergency room with weakness and back pain and imaging studies revealing widely metastatic disease.  HPI: The patient notes a history of papillary thyroid cancer in 1998 status post partial thyroidectomy followed by I-131 x 2.  Last follow-up was approximately 2 years ago.  She had no evidence of disease.  The patient notes the unintentional weight loss of approximately 95 pounds in the past 2 years.  She states the majority of the weight was lost recently.  She only eats when she is hungry.  She has had minimal to eat in the past 2 weeks.   The patient notes "muscle weakness and connective tissue weakness and pain" over the past several months.  She began using crutches to assist with ambulation in 03/2015.  Within the past 8 weeks, she has had difficulty "negotiating crutches'. She has felt like "toxic substances have been coming out" of her tissues.   She has sought myofascial release.  Approximately 10 days ago, she had a medical massage.  She felt something was "knocked loose" as she had an "acidosis attack".  She felt acutely short of breath, vomited and "woozy in the head".  She has felt "heavy like a watermelon" or like a "tire around my middle pulling me down" and "the left side getting weaker".  She notes worsening back pain.  She denies any numbness.  She has been aware of a right breast mass for approximately 1 month.  She denies any skin changes or nipple discharge.  She has never had a mammogram.  She presented to the emergency room with the above complaints.  Labs were notable for a normocytic anemia, markedly elevated alkaline phosphatase (1593), and a low protein, albumen, and calcium.  Chest, abdomen, and pelvic CT revealed  a 2.6 cm right breast mass, multiple lytic lesions, and multi-level cord compression (worse at T9-T10) with several areas of epidural tumor.  Past Medical History  Diagnosis Date  . Cancer   . Hypothyroidism complicating pregnancy in third trimester   . Thyroid cancer     Papillary thyroid cancer    Past Surgical History  Procedure Laterality Date  . Thyroidectomy      Family History  Problem Relation Age of Onset  . Non-Hodgkin's lymphoma Father   Her mother is alive in her 49s.  She has a brother and sister.  Social History:  reports that she has never smoked. She does not have any smokeless tobacco history on file. She reports that she does not drink alcohol or use illicit drugs.  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  She has no children.  She has been visiting a friend, [name], here for the past 8 weeks.  Allergies: No Known Allergies  Medications Prior to Admission  Medication Sig Dispense Refill  . levothyroxine (SYNTHROID, LEVOTHROID) 112 MCG tablet Take 112 mcg by mouth daily before breakfast.      Review of Systems: GENERAL:  Fatigue.  No fevers or sweats.  Weight loss of 95 pounds in the past 2 years. PERFORMANCE STATUS (ECOG):  2 HEENT:  Two episodes of blurry vision.  No runny nose, sore throat, mouth sores or tenderness. Lungs: No shortness of breath or cough.  No hemoptysis. Cardiac:  No chest pain, palpitations,  orthopnea, or PND. Breasts:  Aware of right breast mass x 1 month. GI:  Anorexia.  One episode of emesis.  No nausea, diarrhea, constipation, melena or hematochezia. GU:  No incontinence.  No urgency, frequency, dysuria, or hematuria.  Menses off/on. Musculoskeletal:  Low back pain.  No joint pain.  No muscle tenderness. Extremities:  No pain or swelling. Skin:  No rashes or skin changes. Neuro:  Left sided weakness (lower extremity strength most affected).  No headache, numbness or balance or coordination issues. Endocrine:  History of  thyroid carcinoma s/p partial thyroidectomy.  No diabetes, hot flashes or night sweats. Psych:  No mood changes, depression or anxiety. Pain: Low back pain. Review of systems:  All other systems reviewed and found to be negative.   Physical Exam:  Blood pressure 106/69, pulse 105, temperature 98.7 F (37.1 C), temperature source Oral, resp. rate 22, height 5' 3" (1.6 m), weight 95 lb (43.092 kg), last menstrual period 11/15/2014, SpO2 96 %.  GENERAL:  Well developed, well nourished, lying on her side on the medical unit secondary to back pain. MENTAL STATUS:  Alert and oriented to person, place and time. HEAD:  Brown hair with graying.  Normocephalic, atraumatic, face symmetric, no Cushingoid features. EYES:  Blue eyes.  Pupils equal round and reactive to light and accomodation.  No conjunctivitis or scleral icterus.  Extraocular movement intact. ENT:  Oropharynx clear without lesion.  Tongue normal. Mucous membranes moist.  RESPIRATORY:  Clear to auscultation without rales, wheezes or rhonchi. CARDIOVASCULAR:  Regular rate and rhythm without murmur, rub or gallop. BREAST:  Right breast with 2.5-3 cm mass at the 3-4 o'clock position.  Mass not fixed.  No overlying skin changes.  No nipple discharge.  Left breast without masses, skin changes or nipple discharge. ABDOMEN:  Soft, non-tender, with active bowel sounds, and no hepatosplenomegaly.  No masses. BACK:  No tenderness on percussion of the back. SKIN:  Tattoos.  No rashes, ulcers or lesions. EXTREMITIES: Extremities thin.  No edema, no skin discoloration or tenderness.  No palpable cords. LYMPH NODES: No palpable cervical, supraclavicular, axillary or inguinal adenopathy  NEUROLOGICAL: Alert & oriented, cranial nerves II-XII intact; motor strength 5/5 except left upper and lower extremity 4+/5.  Distal left lower extremity 3+/5.; sensation intact; finger to nose and RAM normal; gait not tested; no clonus or Babinski. PSYCH:   Appropriate.  Results for orders placed or performed during the hospital encounter of 01/19/15 (from the past 48 hour(s))  CK     Status: None   Collection Time: 01/19/15  4:41 PM  Result Value Ref Range   Total CK 49 38 - 234 U/L  CBC with Differential/Platelet     Status: Abnormal   Collection Time: 01/19/15  4:41 PM  Result Value Ref Range   WBC 13.5 (H) 3.6 - 11.0 K/uL   RBC 3.30 (L) 3.80 - 5.20 MIL/uL   Hemoglobin 10.6 (L) 12.0 - 16.0 g/dL   HCT 32.8 (L) 35.0 - 47.0 %   MCV 99.3 80.0 - 100.0 fL   MCH 32.2 26.0 - 34.0 pg   MCHC 32.5 32.0 - 36.0 g/dL   RDW 21.6 (H) 11.5 - 14.5 %   Platelets 362 150 - 440 K/uL   Neutrophils Relative % 58 %   Neutro Abs 7.9 (H) 1.4 - 6.5 K/uL   Lymphocytes Relative 33 %   Lymphs Abs 4.4 (H) 1.0 - 3.6 K/uL   Monocytes Relative 7 %   Monocytes Absolute 1.0 (  H) 0.2 - 0.9 K/uL   Eosinophils Relative 1 %   Eosinophils Absolute 0.2 0 - 0.7 K/uL   Basophils Relative 1 %   Basophils Absolute 0.1 0 - 0.1 K/uL  Comprehensive metabolic panel     Status: Abnormal   Collection Time: 01/19/15  4:41 PM  Result Value Ref Range   Sodium 136 135 - 145 mmol/L   Potassium 3.5 3.5 - 5.1 mmol/L   Chloride 110 101 - 111 mmol/L   CO2 16 (L) 22 - 32 mmol/L   Glucose, Bld 204 (H) 65 - 99 mg/dL   BUN 13 6 - 20 mg/dL   Creatinine, Ser 0.75 0.44 - 1.00 mg/dL   Calcium 8.1 (L) 8.9 - 10.3 mg/dL   Total Protein 6.1 (L) 6.5 - 8.1 g/dL   Albumin 2.5 (L) 3.5 - 5.0 g/dL   AST 59 (H) 15 - 41 U/L   ALT 33 14 - 54 U/L   Alkaline Phosphatase 1593 (H) 38 - 126 U/L   Total Bilirubin 0.4 0.3 - 1.2 mg/dL   GFR calc non Af Amer >60 >60 mL/min   GFR calc Af Amer >60 >60 mL/min    Comment: (NOTE) The eGFR has been calculated using the CKD EPI equation. This calculation has not been validated in all clinical situations. eGFR's persistently <60 mL/min signify possible Chronic Kidney Disease.    Anion gap 10 5 - 15  T4, free     Status: Abnormal   Collection Time: 01/19/15   4:41 PM  Result Value Ref Range   Free T4 0.35 (L) 0.61 - 1.12 ng/dL  TSH     Status: None   Collection Time: 01/19/15  4:41 PM  Result Value Ref Range   TSH 2.108 0.350 - 4.500 uIU/mL  Blood gas, venous     Status: Abnormal   Collection Time: 01/19/15  5:40 PM  Result Value Ref Range   pH, Ven 7.38 7.320 - 7.430   pCO2, Ven 37 (L) 44.0 - 60.0 mmHg   Bicarbonate 21.9 21.0 - 28.0 mEq/L   Acid-base deficit 2.8 (H) 0.0 - 2.0 mmol/L   Patient temperature 37.0    Collection site VEIN    Sample type VEIN   Urinalysis complete, with microscopic ([facility] only)     Status: Abnormal   Collection Time: 01/19/15  8:30 PM  Result Value Ref Range   Color, Urine YELLOW (A) YELLOW   APPearance CLEAR (A) CLEAR   Glucose, UA NEGATIVE NEGATIVE mg/dL   Bilirubin Urine NEGATIVE NEGATIVE   Ketones, ur NEGATIVE NEGATIVE mg/dL   Specific Gravity, Urine 1.005 1.005 - 1.030   Hgb urine dipstick 2+ (A) NEGATIVE   pH 6.0 5.0 - 8.0   Protein, ur NEGATIVE NEGATIVE mg/dL   Nitrite NEGATIVE NEGATIVE   Leukocytes, UA 3+ (A) NEGATIVE   RBC / HPF 0-5 0 - 5 RBC/hpf   WBC, UA TOO NUMEROUS TO COUNT 0 - 5 WBC/hpf   Bacteria, UA RARE (A) NONE SEEN   Squamous Epithelial / LPF 0-5 (A) NONE SEEN   Mucous PRESENT   IFE, Urine (with Tot Prot)     Status: None   Collection Time: 01/19/15  8:30 PM  Result Value Ref Range   Total Protein, Urine RECEIVED IN ERROR    Total Protein, Urine-Ur/day RECEIVED RANDOM URINE, NOT 24 HOUR SAMPLE    Albumin, U SEE [id number] FOR RANDOM REPORT   Comprehensive metabolic panel     Status: Abnormal   Collection  Time: 01/20/15  2:36 AM  Result Value Ref Range   Sodium 139 135 - 145 mmol/L   Potassium 4.1 3.5 - 5.1 mmol/L   Chloride 109 101 - 111 mmol/L   CO2 21 (L) 22 - 32 mmol/L   Glucose, Bld 95 65 - 99 mg/dL   BUN 11 6 - 20 mg/dL   Creatinine, Ser 0.64 0.44 - 1.00 mg/dL   Calcium 8.3 (L) 8.9 - 10.3 mg/dL   Total Protein 5.5 (L) 6.5 - 8.1 g/dL   Albumin 2.3 (L) 3.5 - 5.0 g/dL   AST  54 (H) 15 - 41 U/L   ALT 31 14 - 54 U/L   Alkaline Phosphatase 1534 (H) 38 - 126 U/L   Total Bilirubin 0.5 0.3 - 1.2 mg/dL   GFR calc non Af Amer >60 >60 mL/min   GFR calc Af Amer >60 >60 mL/min    Comment: (NOTE) The eGFR has been calculated using the CKD EPI equation. This calculation has not been validated in all clinical situations. eGFR's persistently <60 mL/min signify possible Chronic Kidney Disease.    Anion gap 9 5 - 15  CBC     Status: Abnormal   Collection Time: 01/20/15  2:36 AM  Result Value Ref Range   WBC 11.4 (H) 3.6 - 11.0 K/uL   RBC 3.04 (L) 3.80 - 5.20 MIL/uL   Hemoglobin 9.8 (L) 12.0 - 16.0 g/dL   HCT 30.0 (L) 35.0 - 47.0 %   MCV 98.7 80.0 - 100.0 fL   MCH 32.1 26.0 - 34.0 pg   MCHC 32.5 32.0 - 36.0 g/dL   RDW 20.5 (H) 11.5 - 14.5 %   Platelets 302 150 - 440 K/uL  Lactate dehydrogenase     Status: Abnormal   Collection Time: 01/20/15  2:36 AM  Result Value Ref Range   LDH 315 (H) 98 - 192 U/L  APTT     Status: Abnormal   Collection Time: 01/20/15  3:50 PM  Result Value Ref Range   aPTT 54 (H) 24 - 36 seconds    Comment:        IF BASELINE aPTT IS ELEVATED, SUGGEST PATIENT RISK ASSESSMENT BE USED TO DETERMINE APPROPRIATE ANTICOAGULANT THERAPY.    Dg Chest 2 View  01/19/2015   CLINICAL DATA:  Muscle weakness and pain for multiple weeks  EXAM: CHEST - 2 VIEW  COMPARISON:  None.  FINDINGS: Cardiac shadow is enlarged. The lungs are well aerated bilaterally. There is soft tissue density along the lateral aspect of the right lung apex with apparent loss of the second and third ribs laterally on the right. There are changes consistent with displaced fracture of the right sixth rib as well as apparent lucent lesion within the lateral aspect of the left left sixth rib right basilar atelectasis is noted. No sizable effusion is seen.  IMPRESSION: Changes suggestive of lytic lesions throughout multiple ribs as well as an apical soft tissue mass on the right.  Right  basilar atelectasis.  CT evaluation of the chest is recommended for further evaluation.   Electronically Signed   By: Inez Catalina M.D.   On: 01/19/2015 18:43   Ct Head Wo Contrast  01/19/2015   CLINICAL DATA:  Presenting with muscle weakness and shaking, 8 weeks duration.  EXAM: CT HEAD WITHOUT CONTRAST  TECHNIQUE: Contiguous axial images were obtained from the base of the skull through the vertex without intravenous contrast.  COMPARISON:  None.  FINDINGS: The brain has a  normal appearance without evidence of atrophy, infarction, mass lesion, hemorrhage, hydrocephalus or extra-axial collection. The calvarium is unremarkable. The paranasal sinuses, middle ears and mastoids are clear.  IMPRESSION: Normal head CT   Electronically Signed   By: Nelson Chimes M.D.   On: 01/19/2015 20:56   Ct Chest W Contrast  01/19/2015   CLINICAL DATA:  Weakness and pain for 2 months, known history of treated thyroid carcinoma  EXAM: CT CHEST, ABDOMEN, AND PELVIS WITH CONTRAST  TECHNIQUE: Multidetector CT imaging of the chest, abdomen and pelvis was performed following the standard protocol during bolus administration of intravenous contrast.  CONTRAST:  16m OMNIPAQUE IOHEXOL 300 MG/ML  SOLN  COMPARISON:  Chest x-ray from earlier in the same day  FINDINGS: CT CHEST FINDINGS  The lungs are well aerated bilaterally. A calcified granuloma is noted within the right middle lobe. Some segmental atelectasis is noted within the lungs bilaterally particularly in the lingula as well as the right lower lobe. No focal confluent infiltrate is seen. No sizable effusion is noted.  The bony structures demonstrate involvement of the sternum, multiple ribs as well as the thoracic spine with expansile predominately lytic lesions. Some isolated lesions are identified within the vertebral bodies which would suggest myelomatous involvement. Multiple compression deformities are identified within the thoracic spine to include T6, T5 and C7. Although  these lesions could represent treated disease the likely diagnosis is myelomatous involvement given the diffuse involvement of the rib cage spine and appendicular skeleton. Multiple areas of central canal stenosis are noted related to the underlying disease process.  In the medial aspect of the right breast there is a 2.6 x 1.8 cm soft tissue lesion which has irregular borders and may represent a breast mass. Further evaluation is recommended.  CT ABDOMEN AND PELVIS FINDINGS  The liver, gallbladder, spleen, adrenal glands and pancreas are within normal limits. The kidneys are well visualized bilaterally without renal calculi or focal mass lesion.  The appendix is not well visualized although no inflammatory changes are identified. The bladder is well distended. A cystic lesion is in the region of the right ovary which measures approximately 7.6 cm in greatest dimension. It has some mural calcifications but otherwise has benign characteristics. The uterus demonstrates an area of decreased attenuation best seen on image number 82 of series 2 likely representing a uterine fibroid.  Diffuse bony involvement is noted similar to that seen in the chest with expansile predominantly lytic lesions highly suggestive of myelomatous involvement. Diffuse spinal involvement is noted with multilevel areas of narrowing. Compression deformities are noted within the lumbar spine with a vertebral plana identified at L5.  IMPRESSION: Diffuse bony involvement as described. These changes suggest mild lumbar this involvement and percutaneous biopsy of an accessible lesion is recommended. A right iliac wing lesion may be the most accessible lesion.  Irregular mass lesion in the medial aspect of the right breast. Further workup is recommended to exclude neoplasm.  Bibasilar atelectatic changes are noted.  Likely benign ovarian cystic lesion.  Hypodensity within the uterus likely representing a uterine fibroid.   Electronically Signed   By:  MInez CatalinaM.D.   On: 01/19/2015 21:21   Ct Abdomen Pelvis W Contrast  01/19/2015   CLINICAL DATA:  Weakness and pain for 2 months, known history of treated thyroid carcinoma  EXAM: CT CHEST, ABDOMEN, AND PELVIS WITH CONTRAST  TECHNIQUE: Multidetector CT imaging of the chest, abdomen and pelvis was performed following the standard protocol during bolus administration of  intravenous contrast.  CONTRAST:  80m OMNIPAQUE IOHEXOL 300 MG/ML  SOLN  COMPARISON:  Chest x-ray from earlier in the same day  FINDINGS: CT CHEST FINDINGS  The lungs are well aerated bilaterally. A calcified granuloma is noted within the right middle lobe. Some segmental atelectasis is noted within the lungs bilaterally particularly in the lingula as well as the right lower lobe. No focal confluent infiltrate is seen. No sizable effusion is noted.  The bony structures demonstrate involvement of the sternum, multiple ribs as well as the thoracic spine with expansile predominately lytic lesions. Some isolated lesions are identified within the vertebral bodies which would suggest myelomatous involvement. Multiple compression deformities are identified within the thoracic spine to include T6, T5 and C7. Although these lesions could represent treated disease the likely diagnosis is myelomatous involvement given the diffuse involvement of the rib cage spine and appendicular skeleton. Multiple areas of central canal stenosis are noted related to the underlying disease process.  In the medial aspect of the right breast there is a 2.6 x 1.8 cm soft tissue lesion which has irregular borders and may represent a breast mass. Further evaluation is recommended.  CT ABDOMEN AND PELVIS FINDINGS  The liver, gallbladder, spleen, adrenal glands and pancreas are within normal limits. The kidneys are well visualized bilaterally without renal calculi or focal mass lesion.  The appendix is not well visualized although no inflammatory changes are identified. The  bladder is well distended. A cystic lesion is in the region of the right ovary which measures approximately 7.6 cm in greatest dimension. It has some mural calcifications but otherwise has benign characteristics. The uterus demonstrates an area of decreased attenuation best seen on image number 82 of series 2 likely representing a uterine fibroid.  Diffuse bony involvement is noted similar to that seen in the chest with expansile predominantly lytic lesions highly suggestive of myelomatous involvement. Diffuse spinal involvement is noted with multilevel areas of narrowing. Compression deformities are noted within the lumbar spine with a vertebral plana identified at L5.  IMPRESSION: Diffuse bony involvement as described. These changes suggest mild lumbar this involvement and percutaneous biopsy of an accessible lesion is recommended. A right iliac wing lesion may be the most accessible lesion.  Irregular mass lesion in the medial aspect of the right breast. Further workup is recommended to exclude neoplasm.  Bibasilar atelectatic changes are noted.  Likely benign ovarian cystic lesion.  Hypodensity within the uterus likely representing a uterine fibroid.   Electronically Signed   By: MInez CatalinaM.D.   On: 01/19/2015 21:21    Assessment:  The patient is a 54y.o. woman with 2 year history of 95 pound weight loss, 10 month history of progressive weakness and pain, a 1 month history of right breast mass, and a 10 day history of acute decline in left sided strength.  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  Suspect metastatic breast cancer given low albumen, protein, and normal calcium and renal function.  She has a normocytic anemia likely multi-factorial and secondary to chronic disease, marrow replacement, or vitamin/iron deficiency secondary to anorexia.  Plan:   1)   Discuss likely diagnosis of metastatic breast cancer.  Additional labs and testing to be sent to rule out multiple myeloma.  Discuss obtaining a tissue diagnosis (right iliac biopsy).  Discuss palliative radiation to spine secondary to cord compression.  Discuss tentative chemotherapy as an outpatient. 2)  Labs:  SPEP, UPEP, serum immunoglobulins, free light chain assay, B12, folate, ferritin, iron studies, CA27.29. 3)  Schedule CT guided right iliac biopsy- discussed with radiology.  Check PT/INR.  Hold heparin. 4)  Consult radiation oncology for palliative XRT to areas of cord compression. 5)  Begin Decadron 4 mg po q 6 hours. 6)  Head MRI with and without contrast. 7)  Discuss patient's thoughts about treatment.  She is unsure when she will return to [place].  Thank you for allowing me to participate in Bradie Sangiovanni 's care.  I will follow her closely with you while hospitalized and after discharge in the outpatient department.  Lequita Asal, MD  01/20/2015, 10:39 PM

## 2015-01-20 NOTE — Progress Notes (Signed)
Initial Nutrition Assessment  DOCUMENTATION CODES:   Severe malnutrition in context of chronic illness  INTERVENTION:   Meals and Snacks: Cater to patient preferences; will send afternoon snack of peaches and bedtime snack of homemade milkshake Medical Food Supplement Therapy: pt reports having concern about artificial sweeteners and although she has drank Ensure in the past, she does not want to continue for that reason at this time.  NUTRITION DIAGNOSIS:   Inadequate oral intake related to chronic illness as evidenced by per patient/family report.  GOAL:   Patient will meet greater than or equal to 90% of their needs  MONITOR:    (Energy Intake, [employer])  REASON FOR ASSESSMENT:   Malnutrition Screening Tool    ASSESSMENT:   Pt admitted with back pain and weakness secondary to lytic bone lesion of hip. Pt with h/o thyroid cancer s/p radiation.  Past Medical History  Diagnosis Date  . Cancer   . Hypothyroidism complicating pregnancy in third trimester   . Thyroid cancer     Papillary thyroid cancer    Diet Order:  Diet Heart Room service appropriate?: Yes; Fluid consistency:: Thin  Current Nutrition: Pt reports eating some breakfast this am.   Food/Nutrition-Related History: Pt reports decreased appetite PTA. Pt reports eating 1-2 meals per day at best and reports tolerating smaller portions. Pt reports usually eating fruits and fruit smoothies PTA.   Medications: NS at 50m/hr  Electrolyte/Renal Profile and Glucose Profile:   Recent Labs Lab 01/19/15 1641 01/20/15 0236  NA 136 139  K 3.5 4.1  CL 110 109  CO2 16* 21*  BUN 13 11  CREATININE 0.75 0.64  CALCIUM 8.1* 8.3*  GLUCOSE 204* 95   Protein Profile:  Recent Labs Lab 01/19/15 1641 01/20/15 0236  ALBUMIN 2.5* 2.3*   Hepatic Function Latest Ref Rng 01/20/2015 01/19/2015  Total Protein 6.5 - 8.1 g/dL 5.5(L) 6.1(L)  Albumin 3.5 - 5.0 g/dL 2.3(L) 2.5(L)  AST 15 - 41 U/L 54(H) 59(H)  ALT  14 - 54 U/L 31 33  Alk Phosphatase 38 - 126 U/L 1534(H) 1593(H)  Total Bilirubin 0.3 - 1.2 mg/dL 0.5 0.4   Nutritional Anemia Profile:  CBC Latest Ref Rng 01/20/2015 01/19/2015  WBC 3.6 - 11.0 K/uL 11.4(H) 13.5(H)  Hemoglobin 12.0 - 16.0 g/dL 9.8(L) 10.6(L)  Hematocrit 35.0 - 47.0 % 30.0(L) 32.8(L)  Platelets 150 - 440 K/uL 302 362    Gastrointestinal Profile: Last BM:  01/19/2015   Nutrition-Focused Physical Exam Findings: Nutrition-Focused physical exam completed. Findings are mild fat depletion of tricep muscles however thoracic and orbital WDL, severe muscle depletion of thigh, calf and patellar regions, muscles in upper body such as temple, clavicle, shoulder, acromion process and scapular regions are WDL, and no edema.    Weight Change: Pt reports weight loss of 100lbs in one year. (51% weight loss in one year)  Skin:  Reviewed, no issues  Height:   Ht Readings from Last 1 Encounters:  01/19/15 _0  (1.6 m)    Weight:   Wt Readings from Last 1 Encounters:  01/19/15 95 lb (43.092 kg)    Ideal Body Weight:  52.3 kg  Wt Readings from Last 10 Encounters:  01/19/15 95 lb (43.092 kg)    BMI:  Body mass index is 16.83 kg/(m^2).  Estimated Nutritional Needs:   Kcal:  1587-1852kcals, BEE: 1102kcals, TEE: (IF 1.2-1.4)(AF 1.2) using IBW of 52.3kg  Protein:  57-67g protein (1.1-1.3g/kg) using IBW of 52.3kg  Fluid:  1308-15675mof fluid (25-3036mg) using  IBW of 52.3kg  EDUCATION NEEDS:   No education needs identified at this time  [place], RD, LDN Pager [phone]

## 2015-01-20 NOTE — Care Management (Signed)
Admitted to [facility] with the diagnosis of lytic bone lesion. Lives in [place]. Visiting her friend, Gypsy Balsam  [phone]) for the last 8 weeks. Works at [employer]. Usually goes to [facility] for physician care. Last was seen by a physician there at [facility] a couple of years ago. No home health. No skilled nursing facility. Does use crutches to aid in ambulation. Decrease po intake noted. Takes care of all activities of daily living herself, still drives.  Shelbie Ammons RN MSN Care Management [phone]

## 2015-01-20 NOTE — Consult Note (Signed)
Except an outstanding is perfect of Radiation Oncology NEW PATIENT EVALUATION  Name: Denise Kaufman  MRN: [id number]  Date:   01/20/2015     DOB: Feb 08, 1961   This 54 y.o. 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  REFERRING PHYSICIAN: No ref. provider found  CHIEF COMPLAINT: No chief complaint on file.   DIAGNOSIS: There were no encounter diagnoses.   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 She was admitted to [facility] with progressively worsening symptoms over last 8 weeks including back pain and generalized weakness. She states she is having lower back pain as well as pelvic pain. She was seen through emergency room found to have multiple bone lytic lesions on imaging.on my review she has diffuse bony involvement throughout her axial skeleton with multiple areas of probable cord impingement. She also has a right iliac wing lesion which is being scheduled to have a fine-needle aspiration performed for tissue confirmation. She also has a 60 CT scan and physical examination a large right breast mass. I been asked to evaluate the patient for possible palliative radiation therapy. She is ambulating with the8 of crutches.  PLANNED TREATMENT REGIMEN: tied radiation therapy to thoracic lumbar spine  PAST MEDICAL HISTORY:  has a past medical history of Cancer; Hypothyroidism complicating pregnancy in third trimester; and Thyroid cancer.    PAST SURGICAL HISTORY:  Past Surgical History  Procedure Laterality Date  . Thyroidectomy      FAMILY HISTORY: family history includes Non-Hodgkin's lymphoma in her father.  SOCIAL HISTORY:  reports that she has never smoked. She does not have any smokeless tobacco history on file. She reports that she does not drink alcohol  or use illicit drugs.  ALLERGIES: Review of patient's allergies indicates no known allergies.  MEDICATIONS:  No current facility-administered medications for this visit.   No current outpatient prescriptions on file.   Facility-Administered Medications Ordered in Other Visits  Medication Dose Route Frequency Provider Last Rate Last Dose  . 0.9 %  sodium chloride infusion   Intravenous Continuous Lytle Butte, MD 75 mL/hr at 01/20/15 0108    . acetaminophen (TYLENOL) tablet 650 mg  650 mg Oral Q6H PRN Lytle Butte, MD       Or  . acetaminophen (TYLENOL) suppository 650 mg  650 mg Rectal Q6H PRN Lytle Butte, MD      . cefTRIAXone (ROCEPHIN) 1 g in dextrose 5 % 50 mL IVPB - Premix  1 g Intravenous Q24H Lytle Butte, MD      . heparin injection 5,000 Units  5,000 Units Subcutaneous 3 times per day Lytle Butte, MD   5,000 Units at 01/20/15 1027  . levothyroxine (SYNTHROID, LEVOTHROID) tablet 112 mcg  112 mcg Oral QAC breakfast Lytle Butte, MD   112 mcg at 01/20/15 1027  . magnesium hydroxide (MILK OF MAGNESIA) suspension 30 mL  30 mL Oral Daily PRN Lytle Butte, MD      . morphine 2 MG/ML injection 2 mg  2 mg Intravenous Q4H PRN Lytle Butte, MD      . morphine 2 MG/ML injection 2 mg  2 mg Intravenous Q3H PRN Epifanio Lesches, MD      . ondansetron (ZOFRAN) tablet 4 mg  4 mg Oral Q6H PRN Lytle Butte, MD  Or  . ondansetron (ZOFRAN) injection 4 mg  4 mg Intravenous Q6H PRN Lytle Butte, MD      . oxyCODONE (Oxy IR/ROXICODONE) immediate release tablet 5 mg  5 mg Oral Q4H PRN Lytle Butte, MD      . zolpidem [facility]) tablet 5 mg  5 mg Oral QHS PRN Harrie Foreman, MD   5 mg at 01/20/15 0108    ECOG PERFORMANCE STATUS:  2 - Symptomatic, <50% confined to bed  REVIEW OF SYSTEMS: except for the significant back pain and difficulty ambulating and feeling of acid spilling from her muscles  Patient denies any weight loss, fatigue, weakness, fever, chills or night sweats.  Patient denies any loss of vision, blurred vision. Patient denies any ringing  of the ears or hearing loss. No irregular heartbeat. Patient denies heart murmur or history of fainting. Patient denies any chest pain or pain radiating to her upper extremities. Patient denies any shortness of breath, difficulty breathing at night, cough or hemoptysis. Patient denies any swelling in the lower legs. Patient denies any nausea vomiting, vomiting of blood, or coffee ground material in the vomitus. Patient denies any stomach pain. Patient states has had normal bowel movements no significant constipation or diarrhea. Patient denies any dysuria, hematuria or significant nocturia. Patient denies any problems walking, swelling in the joints or loss of balance. Patient denies any skin changes, loss of hair or loss of weight. Patient denies any excessive worrying or anxiety or significant depression. Patient denies any problems with insomnia. Patient denies excessive thirst, polyuria, polydipsia. Patient denies any swollen glands, patient denies easy bruising or easy bleeding. Patient denies any recent infections, allergies or URI. Patient "s visual fields have not changed significantly in recent time.    PHYSICAL EXAM: LMP 11/15/2014 (Exact Date) Well-developed female confined to her hospital bed in moderate pain discomfort. Motor sensory and DTR levels are equal and symmetric in the lower extremities. Proprioception is intact. There is a large locally advanced right breast mass which is probably fixed to her chest wall. Well-developed well-nourished patient in NAD. HEENT reveals PERLA, EOMI, discs not visualized.  Oral cavity is clear. No oral mucosal lesions are identified. Neck is clear without evidence of cervical or supraclavicular adenopathy. Lungs are clear to A&P. Cardiac examination is essentially unremarkable with regular rate and rhythm without murmur rub or thrill. Abdomen is benign with no organomegaly or masses  noted. Motor sensory and DTR levels are equal and symmetric in the upper and lower extremities. Cranial nerves II through XII are grossly intact. Proprioception is intact. No peripheral adenopathy or edema is identified. No motor or sensory levels are noted. Crude visual fields are within normal range.   LABORATORY DATA: fine-needle aspirate of pelvic metastatic lesion is pending    RADIOLOGY RESULTS:CT scans of abdomen and pelvis and chest are all reviewed. CT head of is within normal limits   IMPRESSION: widespread metastatic disease to her axial skeleton in 54 year old female with probable breast primary.  PLAN: this time discussed the case personally with medical oncology. They will be ordering a needle biopsy of one of her lesions probably from her pelvis to determine histologic subtype of her disease. I've also asked him to start her on systemic sterilize. I have set up and ordered CT simulation for the morning and will do a simulation and treatment tomorrow 2 areas that I can Compass in a single treatment field incorporating multiple lesions in her thoracic lumbar spine. Will treat with 300  cGy in a single fraction tomorrow and continue up to close to 3000 cGy over the next 9 fractions. Risks and benefits of treatment including skin reaction, alteration of blood counts, possible radiation esophagitis, fatigue, all were discussed in detail with the patient.  I would like to take this opportunity for allowing me to participate in the care of your patient.Armstead Peaks., MD

## 2015-01-20 NOTE — Plan of Care (Signed)
Problem: Discharge Progression Outcomes Goal: Discharge plan in place and appropriate Individualization: Pt is a moderate+ fall risk. Pt with limited movement due to pain in pelvis, L hip and lower back. Pt declined repositioning qx2hrs. Pt requested to be repositioned per request. Offer toileting qx1hr with safety checks. Bed alarm activated. H/o hypothyroidism controlled with synthroid. H/o thyroid cancer.

## 2015-01-20 NOTE — Progress Notes (Signed)
Notified Dr Lavetta Nielsen that pt takes synthroid 112 mcg daily and requested to continue med. TSH level 2.108. MD to place order.

## 2015-01-20 NOTE — Plan of Care (Signed)
Problem: Discharge Progression Outcomes Goal: Discharge plan in place and appropriate Individualization:  Pt is a moderate+ fall risk. Pt with limited movement due to pain in pelvis, L hip and lower back. Pt declined repositioning qx2hrs. Pt requested to be repositioned per request. Offer toileting qx1hr with safety checks. Bed alarm activated. H/o hypothyroidism controlled with synthroid. H/o thyroid cancer.    Goal: Other Discharge Outcomes/Goals Outcome: Progressing VSS. Pt reported mild pelvic and buttocks pain, but declined further action. Ambien given for insomnia with good effect. Uses bedpan with 1xassist. Oncology consult pending. IFE urine added to urinalysis and sent to [facility] per Page from lab.

## 2015-01-20 NOTE — Progress Notes (Signed)
[place] at [facility]   PATIENT NAME: Denise Kaufman    MR#:  [id number]  DATE OF BIRTH:  1961/02/21  SUBJECTIVE: And 54 year old female with history of papillary thyroid carcinoma status post treatment in 1998  with surgery, radiation comes in because of fall lower back pain and weight loss and fatigue. Found to have multiple lytic lesions in thoracic,*numb and ribs. Patient says that she is been using crutches for the past few weeks. Visiting a friend from [place]. She denies nausea vomiting, abdominal pain. Does have loss of appetite. Denies any numbness or tingling in the tingling numbness or weakness in the legs. No bowel, bladder incontinence.    CHIEF COMPLAINT:   Chief Complaint  Patient presents with  . Weakness    REVIEW OF SYSTEMS:   ROS CONSTITUTIONAL: No fever, fatigue or weakness.  EYES: No blurred or double vision.  EARS, NOSE, AND THROAT: No tinnitus or ear pain.  RESPIRATORY: No cough, shortness of breath, wheezing or hemoptysis.  CARDIOVASCULAR: No chest pain, orthopnea, edema.  GASTROINTESTINAL: No nausea, vomiting, diarrhea or abdominal pain.  GENITOURINARY: No dysuria, hematuria.  ENDOCRINE: No polyuria, nocturia,  HEMATOLOGY: No anemia, easy bruising or bleeding SKIN: No rash or lesion. MUSCULOSKELETAL: No joint pain or arthritis.   NEUROLOGIC: No tingling, numbness, weakness.  PSYCHIATRY: No anxiety or depression.   DRUG ALLERGIES:  No Known Allergies  VITALS:  Blood pressure 132/76, pulse 109, temperature 98.2 F (36.8 C), temperature source Oral, resp. rate 18, height 5' 3"  (1.6 m), weight 43.092 kg (95 lb), last menstrual period 11/15/2014, SpO2 97 %.  PHYSICAL EXAMINATION:  GENERAL:  54 y.o.-year-old patient lying in the bed cachectic and pale looking. EYES: Pupils equal, round, reactive to light and accommodation. No scleral icterus. Extraocular muscles intact.  HEENT: Head atraumatic, normocephalic.  Oropharynx and nasopharynx clear.  NECK:  Supple, no jugular venous distention. No thyroid enlargement, no tenderness.  LUNGS: Normal breath sounds bilaterally, no wheezing, rales,rhonchi or crepitation. No use of accessory muscles of respiration.  CARDIOVASCULAR: S1, S2 normal. No murmurs, rubs, or gallops.  ABDOMEN: Soft, nontender, nondistended. Bowel sounds present. No organomegaly or mass.  EXTREMITIES: No pedal edema, cyanosis, or clubbing.  NEUROLOGIC: Cranial nerves II through XII are intact. Muscle strength 5/5 in all extremities. Sensation intact. Gait not checked.  PSYCHIATRIC: The patient is alert and oriented x 3.  SKIN: No obvious rash, lesion, or ulcer.    LABORATORY PANEL:   CBC  Recent Labs Lab 01/20/15 0236  WBC 11.4*  HGB 9.8*  HCT 30.0*  PLT 302   ------------------------------------------------------------------------------------------------------------------  Chemistries   Recent Labs Lab 01/20/15 0236  NA 139  K 4.1  CL 109  CO2 21*  GLUCOSE 95  BUN 11  CREATININE 0.64  CALCIUM 8.3*  AST 54*  ALT 31  ALKPHOS 1534*  BILITOT 0.5   ------------------------------------------------------------------------------------------------------------------  Cardiac Enzymes No results for input(s): TROPONINI in the last 168 hours. ------------------------------------------------------------------------------------------------------------------  RADIOLOGY:  Dg Chest 2 View  01/19/2015   CLINICAL DATA:  Muscle weakness and pain for multiple weeks  EXAM: CHEST - 2 VIEW  COMPARISON:  None.  FINDINGS: Cardiac shadow is enlarged. The lungs are well aerated bilaterally. There is soft tissue density along the lateral aspect of the right lung apex with apparent loss of the second and third ribs laterally on the right. There are changes consistent with displaced fracture of the right sixth rib as well as apparent lucent lesion within the lateral  aspect of the left left  sixth rib right basilar atelectasis is noted. No sizable effusion is seen.  IMPRESSION: Changes suggestive of lytic lesions throughout multiple ribs as well as an apical soft tissue mass on the right.  Right basilar atelectasis.  CT evaluation of the chest is recommended for further evaluation.   Electronically Signed   By: Inez Catalina M.D.   On: 01/19/2015 18:43   Ct Head Wo Contrast  01/19/2015   CLINICAL DATA:  Presenting with muscle weakness and shaking, 8 weeks duration.  EXAM: CT HEAD WITHOUT CONTRAST  TECHNIQUE: Contiguous axial images were obtained from the base of the skull through the vertex without intravenous contrast.  COMPARISON:  None.  FINDINGS: The brain has a normal appearance without evidence of atrophy, infarction, mass lesion, hemorrhage, hydrocephalus or extra-axial collection. The calvarium is unremarkable. The paranasal sinuses, middle ears and mastoids are clear.  IMPRESSION: Normal head CT   Electronically Signed   By: Nelson Chimes M.D.   On: 01/19/2015 20:56   Ct Chest W Contrast  01/19/2015   CLINICAL DATA:  Weakness and pain for 2 months, known history of treated thyroid carcinoma  EXAM: CT CHEST, ABDOMEN, AND PELVIS WITH CONTRAST  TECHNIQUE: Multidetector CT imaging of the chest, abdomen and pelvis was performed following the standard protocol during bolus administration of intravenous contrast.  CONTRAST:  64m OMNIPAQUE IOHEXOL 300 MG/ML  SOLN  COMPARISON:  Chest x-ray from earlier in the same day  FINDINGS: CT CHEST FINDINGS  The lungs are well aerated bilaterally. A calcified granuloma is noted within the right middle lobe. Some segmental atelectasis is noted within the lungs bilaterally particularly in the lingula as well as the right lower lobe. No focal confluent infiltrate is seen. No sizable effusion is noted.  The bony structures demonstrate involvement of the sternum, multiple ribs as well as the thoracic spine with expansile predominately lytic lesions. Some isolated  lesions are identified within the vertebral bodies which would suggest myelomatous involvement. Multiple compression deformities are identified within the thoracic spine to include T6, T5 and C7. Although these lesions could represent treated disease the likely diagnosis is myelomatous involvement given the diffuse involvement of the rib cage spine and appendicular skeleton. Multiple areas of central canal stenosis are noted related to the underlying disease process.  In the medial aspect of the right breast there is a 2.6 x 1.8 cm soft tissue lesion which has irregular borders and may represent a breast mass. Further evaluation is recommended.  CT ABDOMEN AND PELVIS FINDINGS  The liver, gallbladder, spleen, adrenal glands and pancreas are within normal limits. The kidneys are well visualized bilaterally without renal calculi or focal mass lesion.  The appendix is not well visualized although no inflammatory changes are identified. The bladder is well distended. A cystic lesion is in the region of the right ovary which measures approximately 7.6 cm in greatest dimension. It has some mural calcifications but otherwise has benign characteristics. The uterus demonstrates an area of decreased attenuation best seen on image number 82 of series 2 likely representing a uterine fibroid.  Diffuse bony involvement is noted similar to that seen in the chest with expansile predominantly lytic lesions highly suggestive of myelomatous involvement. Diffuse spinal involvement is noted with multilevel areas of narrowing. Compression deformities are noted within the lumbar spine with a vertebral plana identified at L5.  IMPRESSION: Diffuse bony involvement as described. These changes suggest mild lumbar this involvement and percutaneous biopsy of an accessible lesion  is recommended. A right iliac wing lesion may be the most accessible lesion.  Irregular mass lesion in the medial aspect of the right breast. Further workup is  recommended to exclude neoplasm.  Bibasilar atelectatic changes are noted.  Likely benign ovarian cystic lesion.  Hypodensity within the uterus likely representing a uterine fibroid.   Electronically Signed   By: Inez Catalina M.D.   On: 01/19/2015 21:21   Ct Abdomen Pelvis W Contrast  01/19/2015   CLINICAL DATA:  Weakness and pain for 2 months, known history of treated thyroid carcinoma  EXAM: CT CHEST, ABDOMEN, AND PELVIS WITH CONTRAST  TECHNIQUE: Multidetector CT imaging of the chest, abdomen and pelvis was performed following the standard protocol during bolus administration of intravenous contrast.  CONTRAST:  46m OMNIPAQUE IOHEXOL 300 MG/ML  SOLN  COMPARISON:  Chest x-ray from earlier in the same day  FINDINGS: CT CHEST FINDINGS  The lungs are well aerated bilaterally. A calcified granuloma is noted within the right middle lobe. Some segmental atelectasis is noted within the lungs bilaterally particularly in the lingula as well as the right lower lobe. No focal confluent infiltrate is seen. No sizable effusion is noted.  The bony structures demonstrate involvement of the sternum, multiple ribs as well as the thoracic spine with expansile predominately lytic lesions. Some isolated lesions are identified within the vertebral bodies which would suggest myelomatous involvement. Multiple compression deformities are identified within the thoracic spine to include T6, T5 and C7. Although these lesions could represent treated disease the likely diagnosis is myelomatous involvement given the diffuse involvement of the rib cage spine and appendicular skeleton. Multiple areas of central canal stenosis are noted related to the underlying disease process.  In the medial aspect of the right breast there is a 2.6 x 1.8 cm soft tissue lesion which has irregular borders and may represent a breast mass. Further evaluation is recommended.  CT ABDOMEN AND PELVIS FINDINGS  The liver, gallbladder, spleen, adrenal glands and  pancreas are within normal limits. The kidneys are well visualized bilaterally without renal calculi or focal mass lesion.  The appendix is not well visualized although no inflammatory changes are identified. The bladder is well distended. A cystic lesion is in the region of the right ovary which measures approximately 7.6 cm in greatest dimension. It has some mural calcifications but otherwise has benign characteristics. The uterus demonstrates an area of decreased attenuation best seen on image number 82 of series 2 likely representing a uterine fibroid.  Diffuse bony involvement is noted similar to that seen in the chest with expansile predominantly lytic lesions highly suggestive of myelomatous involvement. Diffuse spinal involvement is noted with multilevel areas of narrowing. Compression deformities are noted within the lumbar spine with a vertebral plana identified at L5.  IMPRESSION: Diffuse bony involvement as described. These changes suggest mild lumbar this involvement and percutaneous biopsy of an accessible lesion is recommended. A right iliac wing lesion may be the most accessible lesion.  Irregular mass lesion in the medial aspect of the right breast. Further workup is recommended to exclude neoplasm.  Bibasilar atelectatic changes are noted.  Likely benign ovarian cystic lesion.  Hypodensity within the uterus likely representing a uterine fibroid.   Electronically Signed   By: MInez CatalinaM.D.   On: 01/19/2015 21:21    EKG:   Orders placed or performed during the hospital encounter of 01/19/15  . ED EKG  . ED EKG  . EKG 12-Lead  . EKG 12-Lead  ASSESSMENT AND PLAN:   #1. Generalized weakness secondary to multiple lytic lesions; depression diagnoses includes multiple myeloma, breast cancer with the bone metastases. Patient is further extensive workup including SPEP, UPEP, serum reactive protein, LDH, beta-2 microglobulin, oncology consult. She did receive radiation therapy for thyroid  cancer which will put her in risk for breast cancer. Oncology consult is appreciated.  #2 UTI: Continue Rocephin. Continue to follow urine cultures. #3 generalized weakness likely secondary to #1 continue IV fluids with aggressive hydration. Upper and lower back pain secondary to lytic lesions and also vertebral compression fractures continue pain medications, oncology consult for possible bone biopsy. Discussed the plan with the patient in detail.   All the records are reviewed and case discussed with Care Management/Social Workerr. Management plans discussed with the patient, family and they are in agreement.  CODE STATUS: Full  TOTAL TIME TAKING CARE OF THIS PATIENT: 30mnutes.   POSSIBLE D/C IN 1-2 DAYS, DEPENDING ON CLINICAL CONDITION.   KEpifanio LeschesM.D on 01/20/2015 at 8:57 AM  Between 7am to 6pm - Pager - [phone]  After 6pm go to [email or web address] - password [email or web address] [facility] HospitalHospitalists  Office  [phone] CC: Primary care physician; No primary care provider on file.

## 2015-01-20 NOTE — Plan of Care (Addendum)
Problem: Discharge Progression Outcomes Goal: Other Discharge Outcomes/Goals Outcome: Not Progressing Patient going for CT guided biopsy tomorrow, she will need to be NPO after midnight tonight, protime and PTT needs to be drawn tomorrow, and heparin held tomorrow.  Patient's pain has remained under control today, tolerating diet, VSS, also starting radiation tomorrow and order in for MRI of brain.

## 2015-01-21 ENCOUNTER — Ambulatory Visit
Admission: RE | Admit: 2015-01-21 | Discharge: 2015-01-21 | Disposition: A | Discharge status: Home / Self Care | Payer: BLUE CROSS/BLUE SHIELD | Source: Ambulatory Visit | Attending: Radiation Oncology | Admitting: Radiation Oncology

## 2015-01-21 ENCOUNTER — Inpatient Hospital Stay: Payer: BLUE CROSS/BLUE SHIELD

## 2015-01-21 DIAGNOSIS — C7951 Secondary malignant neoplasm of bone: Secondary | ICD-10-CM | POA: Insufficient documentation

## 2015-01-21 DIAGNOSIS — Z510 Encounter for antineoplastic radiation therapy: Secondary | ICD-10-CM | POA: Insufficient documentation

## 2015-01-21 LAB — PARATHYROID HORMONE, INTACT (NO CA): PTH: 65 pg/mL (ref 15–65)

## 2015-01-21 LAB — FERRITIN: Ferritin: 627 ng/mL — ABNORMAL HIGH (ref 11–307)

## 2015-01-21 LAB — VITAMIN D 25 HYDROXY (VIT D DEFICIENCY, FRACTURES): VIT D 25 HYDROXY: 25 ng/mL — AB (ref 30.0–100.0)

## 2015-01-21 LAB — HEMOGLOBINOPATHY EVALUATION
HGB C: 0 %
HGB F QUANT: 0 % (ref 0.0–2.0)
Hgb A2 Quant: 2.3 % (ref 0.7–3.1)
Hgb A: 97.7 % (ref 94.0–98.0)
Hgb S Quant: 0 %

## 2015-01-21 LAB — BASIC METABOLIC PANEL
Anion gap: 7 (ref 5–15)
BUN: 10 mg/dL (ref 6–20)
CO2: 22 mmol/L (ref 22–32)
Calcium: 7.8 mg/dL — ABNORMAL LOW (ref 8.9–10.3)
Chloride: 110 mmol/L (ref 101–111)
Creatinine, Ser: 0.56 mg/dL (ref 0.44–1.00)
GFR calc Af Amer: >60 mL/min (ref >60)
GFR calc non Af Amer: >60 mL/min (ref >60)
Glucose, Bld: 151 mg/dL — ABNORMAL HIGH (ref 65–99)
Potassium: 4.5 mmol/L (ref 3.5–5.1)
Sodium: 139 mmol/L (ref 135–145)

## 2015-01-21 LAB — PROTIME-INR
INR: 1.14
Prothrombin Time: 14.8 seconds (ref 11.4–15.0)

## 2015-01-21 LAB — IGG, IGA, IGM
IgA: 278 mg/dL (ref 87–352)
IgG (Immunoglobin G), Serum: 1396 mg/dL (ref 700–1600)
IgM, Serum: 257 mg/dL — ABNORMAL HIGH (ref 26–217)

## 2015-01-21 LAB — BETA 2 MICROGLOBULIN, SERUM: BETA 2 MICROGLOBULIN: 3.4 mg/L — AB (ref 0.6–2.4)

## 2015-01-21 LAB — IRON AND TIBC
Iron: 86 ug/dL (ref 28–170)
Saturation Ratios: 36 % — ABNORMAL HIGH (ref 10.4–31.8)
TIBC: 242 ug/dL — ABNORMAL LOW (ref 250–450)
UIBC: 156 ug/dL

## 2015-01-21 LAB — FOLATE: Folate: 4.9 ng/mL — ABNORMAL LOW (ref >5.9)

## 2015-01-21 LAB — VITAMIN B12: Vitamin B-12: 1910 pg/mL — ABNORMAL HIGH (ref 180–914)

## 2015-01-21 MED ORDER — FENTANYL CITRATE (PF) 100 MCG/2ML IJ SOLN
INTRAMUSCULAR | Status: AC
  Filled 2015-01-21: qty 4

## 2015-01-21 MED ORDER — DEXTROSE 5 % IV SOLN
1.0000 g | INTRAVENOUS | Status: DC
  Administered 2015-01-21 – 2015-01-22 (×2): 1 g via INTRAVENOUS
  Filled 2015-01-21 (×3): qty 10

## 2015-01-21 MED ORDER — GADOBENATE DIMEGLUMINE 529 MG/ML IV SOLN
10.0000 mL | Freq: Once | INTRAVENOUS | Status: AC | PRN
  Administered 2015-01-21: 8 mL via INTRAVENOUS

## 2015-01-21 MED ORDER — FENTANYL CITRATE (PF) 100 MCG/2ML IJ SOLN
INTRAMUSCULAR | Status: AC | PRN
  Administered 2015-01-21: 16:00:00 50 ug via INTRAVENOUS

## 2015-01-21 MED ORDER — MIDAZOLAM HCL 5 MG/5ML IJ SOLN
INTRAMUSCULAR | Status: AC
  Filled 2015-01-21: qty 5

## 2015-01-21 MED ORDER — MIDAZOLAM HCL 5 MG/5ML IJ SOLN
INTRAMUSCULAR | Status: AC | PRN
  Administered 2015-01-21: 0.5 mg via INTRAVENOUS
  Administered 2015-01-21: 16:00:00 1 mg via INTRAVENOUS

## 2015-01-21 NOTE — Procedures (Signed)
Under CT guidance, biopsy of right iliac bone lesion was performed. No immediate complication.

## 2015-01-21 NOTE — Plan of Care (Signed)
Problem: Discharge Progression Outcomes Goal: Other Discharge Outcomes/Goals Outcome: Progressing Pt went for radiation simulation this am. Then  At 1230 went for  1st radiation tx.  Morphine given prior to transport  To cc. Mri done today. Also pt went this pm for ct guided bx of lesion/ good sample obtained per  Ct. ivfs cont.

## 2015-01-21 NOTE — Progress Notes (Signed)
[place] [facility]   PATIENT NAME: Denise Kaufman    MR#:  [id number]  DATE OF BIRTH:  01/01/1961  SUBJECTIVE: And 54 year old female with history of papillary thyroid carcinoma status post treatment in 1998  with surgery, radiation comes in because of fall lower back pain and weight loss and fatigue. Found to have multiple lytic lesions in thoracic,*numb and ribs. Patient says that she is been using crutches for the past few weeks. Visiting a friend from [place]. Started on Decadron by the oncologist today she feels better and feels like she is more energetic. Had a lot of questions about her treatment plan and diagnostic workup. Went for MRI of the brain which showed skull lesions but no brain lesions.    CHIEF COMPLAINT:   Chief Complaint  Patient presents with  . Weakness    REVIEW OF SYSTEMS:   Review of Systems  Constitutional: Negative for fever and chills.  HENT: Negative for hearing loss.   Eyes: Negative for blurred vision, double vision and photophobia.  Respiratory: Negative for cough, hemoptysis and shortness of breath.   Cardiovascular: Negative for palpitations, orthopnea and leg swelling.  Gastrointestinal: Negative for vomiting, abdominal pain and diarrhea.  Genitourinary: Negative for dysuria and urgency.  Musculoskeletal: Negative for myalgias and neck pain.  Skin: Negative for rash.  Neurological: Negative for dizziness, focal weakness, seizures, weakness and headaches.  Psychiatric/Behavioral: Negative for memory loss. The patient does not have insomnia.     DRUG ALLERGIES:  No Known Allergies  VITALS:  Blood pressure 120/85, pulse 112, temperature 98 F (36.7 C), temperature source Oral, resp. rate 22, height 5' 3"  (1.6 m), weight 43.092 kg (95 lb), last menstrual period 11/15/2014, SpO2 94 %.  PHYSICAL EXAMINATION:  GENERAL:  54 y.o.-year-old patient lying in the bed cachectic and pale looking. EYES: Pupils  equal, round, reactive to light and accommodation. No scleral icterus. Extraocular muscles intact.  HEENT: Head atraumatic, normocephalic. Oropharynx and nasopharynx clear.  NECK:  Supple, no jugular venous distention. No thyroid enlargement, no tenderness.  LUNGS: Normal breath sounds bilaterally, no wheezing, rales,rhonchi or crepitation. No use of accessory muscles of respiration.  CARDIOVASCULAR: S1, S2 normal. No murmurs, rubs, or gallops.  ABDOMEN: Soft, nontender, nondistended. Bowel sounds present. No organomegaly or mass.  EXTREMITIES: No pedal edema, cyanosis, or clubbing.  NEUROLOGIC: Cranial nerves II through XII are intact. Muscle strength 5/5 in all extremities. Sensation intact. Gait not checked.  PSYCHIATRIC: The patient is alert and oriented x 3.  SKIN: No obvious rash, lesion, or ulcer.    LABORATORY PANEL:   CBC  Recent Labs Lab 01/20/15 0236  WBC 11.4*  HGB 9.8*  HCT 30.0*  PLT 302   ------------------------------------------------------------------------------------------------------------------  Chemistries   Recent Labs Lab 01/20/15 0236 01/21/15 0422  NA 139 139  K 4.1 4.5  CL 109 110  CO2 21* 22  GLUCOSE 95 151*  BUN 11 10  CREATININE 0.64 0.56  CALCIUM 8.3* 7.8*  AST 54*  --   ALT 31  --   ALKPHOS 1534*  --   BILITOT 0.5  --    ------------------------------------------------------------------------------------------------------------------  Cardiac Enzymes No results for input(s): TROPONINI in the last 168 hours. ------------------------------------------------------------------------------------------------------------------  RADIOLOGY:  Dg Chest 2 View  01/19/2015   CLINICAL DATA:  Muscle weakness and pain for multiple weeks  EXAM: CHEST - 2 VIEW  COMPARISON:  None.  FINDINGS: Cardiac shadow is enlarged. The lungs are well aerated bilaterally. There is soft  tissue density along the lateral aspect of the right lung apex with apparent  loss of the second and third ribs laterally on the right. There are changes consistent with displaced fracture of the right sixth rib as well as apparent lucent lesion within the lateral aspect of the left left sixth rib right basilar atelectasis is noted. No sizable effusion is seen.  IMPRESSION: Changes suggestive of lytic lesions throughout multiple ribs as well as an apical soft tissue mass on the right.  Right basilar atelectasis.  CT evaluation of the chest is recommended for further evaluation.   Electronically Signed   By: Inez Catalina M.D.   On: 01/19/2015 18:43   Ct Head Wo Contrast  01/19/2015   CLINICAL DATA:  Presenting with muscle weakness and shaking, 8 weeks duration.  EXAM: CT HEAD WITHOUT CONTRAST  TECHNIQUE: Contiguous axial images were obtained from the base of the skull through the vertex without intravenous contrast.  COMPARISON:  None.  FINDINGS: The brain has a normal appearance without evidence of atrophy, infarction, mass lesion, hemorrhage, hydrocephalus or extra-axial collection. The calvarium is unremarkable. The paranasal sinuses, middle ears and mastoids are clear.  IMPRESSION: Normal head CT   Electronically Signed   By: Nelson Chimes M.D.   On: 01/19/2015 20:56   Ct Chest W Contrast  01/19/2015   CLINICAL DATA:  Weakness and pain for 2 months, known history of treated thyroid carcinoma  EXAM: CT CHEST, ABDOMEN, AND PELVIS WITH CONTRAST  TECHNIQUE: Multidetector CT imaging of the chest, abdomen and pelvis was performed following the standard protocol during bolus administration of intravenous contrast.  CONTRAST:  53m OMNIPAQUE IOHEXOL 300 MG/ML  SOLN  COMPARISON:  Chest x-ray from earlier in the same day  FINDINGS: CT CHEST FINDINGS  The lungs are well aerated bilaterally. A calcified granuloma is noted within the right middle lobe. Some segmental atelectasis is noted within the lungs bilaterally particularly in the lingula as well as the right lower lobe. No focal confluent  infiltrate is seen. No sizable effusion is noted.  The bony structures demonstrate involvement of the sternum, multiple ribs as well as the thoracic spine with expansile predominately lytic lesions. Some isolated lesions are identified within the vertebral bodies which would suggest myelomatous involvement. Multiple compression deformities are identified within the thoracic spine to include T6, T5 and C7. Although these lesions could represent treated disease the likely diagnosis is myelomatous involvement given the diffuse involvement of the rib cage spine and appendicular skeleton. Multiple areas of central canal stenosis are noted related to the underlying disease process.  In the medial aspect of the right breast there is a 2.6 x 1.8 cm soft tissue lesion which has irregular borders and may represent a breast mass. Further evaluation is recommended.  CT ABDOMEN AND PELVIS FINDINGS  The liver, gallbladder, spleen, adrenal glands and pancreas are within normal limits. The kidneys are well visualized bilaterally without renal calculi or focal mass lesion.  The appendix is not well visualized although no inflammatory changes are identified. The bladder is well distended. A cystic lesion is in the region of the right ovary which measures approximately 7.6 cm in greatest dimension. It has some mural calcifications but otherwise has benign characteristics. The uterus demonstrates an area of decreased attenuation best seen on image number 82 of series 2 likely representing a uterine fibroid.  Diffuse bony involvement is noted similar to that seen in the chest with expansile predominantly lytic lesions highly suggestive of myelomatous involvement. Diffuse  spinal involvement is noted with multilevel areas of narrowing. Compression deformities are noted within the lumbar spine with a vertebral plana identified at L5.  IMPRESSION: Diffuse bony involvement as described. These changes suggest mild lumbar this involvement and  percutaneous biopsy of an accessible lesion is recommended. A right iliac wing lesion may be the most accessible lesion.  Irregular mass lesion in the medial aspect of the right breast. Further workup is recommended to exclude neoplasm.  Bibasilar atelectatic changes are noted.  Likely benign ovarian cystic lesion.  Hypodensity within the uterus likely representing a uterine fibroid.   Electronically Signed   By: Inez Catalina M.D.   On: 01/19/2015 21:21   Mr Jeri Cos GD Contrast  01/21/2015   CLINICAL DATA:  Headaches and dizziness. Left-sided weakness. Remote history of thyroid cancer. Diffuse skeletal abnormalities with lytic lesions.  EXAM: MRI HEAD WITHOUT AND WITH CONTRAST  TECHNIQUE: Multiplanar, multiecho pulse sequences of the brain and surrounding structures were obtained without and with intravenous contrast.  CONTRAST:  74m MULTIHANCE GADOBENATE DIMEGLUMINE 529 MG/ML IV SOLN  COMPARISON:  CT of the chest and abdomen 01/19/2015  FINDINGS: Mild periventricular white matter changes are somewhat advanced for age. No acute infarct, hemorrhage, or mass lesion is present.  Flow is present in the major intracranial arteries. The globes and orbits are intact. The paranasal sinuses are clear. Minimal fluid is present in the right mastoid air cells. No obstructing nasopharyngeal lesion is present.  Postcontrast images demonstrate no pathologic enhancement of the brain. Multiple skull lesions are noted. A high a posterior left parietal lesion measures 1.6 x 2.3 x 1.9 cm. Multiple other smaller lesions are present without mass effect.  A lesion in the clivus extends to the skullbase bilaterally, right greater than left. This measured 15 x 31 mm on sagittal images just to the right of midline. There is confluent osseous signal abnormality at C2 and C3. This results in at least moderate central canal stenosis at the C2-3 level.  IMPRESSION: 1. Mild age advanced atrophy without evidence for metastatic disease to the  brain. 2. Multiple enhancing lesions of the calvarium compatible with multiple myeloma or metastatic disease. 3. Marked signal abnormality and enhancement at the inferior clivus and extending through the upper cervical spine with moderate to severe central canal stenosis at C2-3. This may result in the patient's symptoms.   Electronically Signed   By: CSan MorelleM.D.   On: 01/21/2015 10:56   Ct Abdomen Pelvis W Contrast  01/19/2015   CLINICAL DATA:  Weakness and pain for 2 months, known history of treated thyroid carcinoma  EXAM: CT CHEST, ABDOMEN, AND PELVIS WITH CONTRAST  TECHNIQUE: Multidetector CT imaging of the chest, abdomen and pelvis was performed following the standard protocol during bolus administration of intravenous contrast.  CONTRAST:  83mOMNIPAQUE IOHEXOL 300 MG/ML  SOLN  COMPARISON:  Chest x-ray from earlier in the same day  FINDINGS: CT CHEST FINDINGS  The lungs are well aerated bilaterally. A calcified granuloma is noted within the right middle lobe. Some segmental atelectasis is noted within the lungs bilaterally particularly in the lingula as well as the right lower lobe. No focal confluent infiltrate is seen. No sizable effusion is noted.  The bony structures demonstrate involvement of the sternum, multiple ribs as well as the thoracic spine with expansile predominately lytic lesions. Some isolated lesions are identified within the vertebral bodies which would suggest myelomatous involvement. Multiple compression deformities are identified within the thoracic spine to include  T6, T5 and C7. Although these lesions could represent treated disease the likely diagnosis is myelomatous involvement given the diffuse involvement of the rib cage spine and appendicular skeleton. Multiple areas of central canal stenosis are noted related to the underlying disease process.  In the medial aspect of the right breast there is a 2.6 x 1.8 cm soft tissue lesion which has irregular borders and may  represent a breast mass. Further evaluation is recommended.  CT ABDOMEN AND PELVIS FINDINGS  The liver, gallbladder, spleen, adrenal glands and pancreas are within normal limits. The kidneys are well visualized bilaterally without renal calculi or focal mass lesion.  The appendix is not well visualized although no inflammatory changes are identified. The bladder is well distended. A cystic lesion is in the region of the right ovary which measures approximately 7.6 cm in greatest dimension. It has some mural calcifications but otherwise has benign characteristics. The uterus demonstrates an area of decreased attenuation best seen on image number 82 of series 2 likely representing a uterine fibroid.  Diffuse bony involvement is noted similar to that seen in the chest with expansile predominantly lytic lesions highly suggestive of myelomatous involvement. Diffuse spinal involvement is noted with multilevel areas of narrowing. Compression deformities are noted within the lumbar spine with a vertebral plana identified at L5.  IMPRESSION: Diffuse bony involvement as described. These changes suggest mild lumbar this involvement and percutaneous biopsy of an accessible lesion is recommended. A right iliac wing lesion may be the most accessible lesion.  Irregular mass lesion in the medial aspect of the right breast. Further workup is recommended to exclude neoplasm.  Bibasilar atelectatic changes are noted.  Likely benign ovarian cystic lesion.  Hypodensity within the uterus likely representing a uterine fibroid.   Electronically Signed   By: Inez Catalina M.D.   On: 01/19/2015 21:21    EKG:   Orders placed or performed during the hospital encounter of 01/19/15  . ED EKG  . ED EKG  . EKG 12-Lead  . EKG 12-Lead    ASSESSMENT AND PLAN:   #1. 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 Unable to exclude   multiple myeloma. Patient scheduled to have right  ileac  bone  biopsy. MRI of the brain showed  Skull lesion. SPEP, UPEP. Consult radiation oncology  Palliative radiation therapy to spine secondary to cord compression. Started on Decadron 4 mg every 6 hours. #2 UTI: Continue Rocephin. ,urine Cultures did not show any growth. #3 generalized weakness likely secondary to #1 DC IV fluids,   All the records are reviewed and case discussed with Care Management/Social Workerr. Management plans discussed with the patient, family and they are in agreement.  CODE STATUS: Full  TOTAL TIME TAKING CARE OF THIS PATIENT: 81mnutes.   POSSIBLE D/C IN 1-2 DAYS, DEPENDING ON CLINICAL CONDITION.   KEpifanio LeschesM.D on 01/21/2015 at 1:00 PM  Between 7am to 6pm - Pager - [phone]  After 6pm go to [email or web address] - password [email or web address] [email or web address]  Office  [phone] CC: Primary care physician; No primary care provider on file.

## 2015-01-21 NOTE — Procedures (Signed)
Discussed procedure and risks with patient. Informed consent obtained. Will perform CT guided right iliac bone biopsy.

## 2015-01-21 NOTE — Plan of Care (Signed)
Problem: Discharge Progression Outcomes Goal: Discharge plan in place and appropriate Individualization:  Pt is a moderate+ fall risk. Pt with limited movement due to pain in pelvis, L hip and lower back. Pt declined repositioning qx2hrs. Pt requested to be repositioned per request. Offer toileting qx1hr with safety checks. Bed alarm activated. H/o hypothyroidism controlled with synthroid. H/o thyroid cancer. Goal: Other Discharge Outcomes/Goals Outcome: Progressing VSS. Pt reported mild pain, declined an intervention. Heparin not given. NPO since midnight for CT Guided biopsy. MRI pending. Protein electro 24hr urine collection initiated at 0220 7/15.

## 2015-01-22 DIAGNOSIS — C73 Malignant neoplasm of thyroid gland: Secondary | ICD-10-CM

## 2015-01-22 DIAGNOSIS — C7951 Secondary malignant neoplasm of bone: Secondary | ICD-10-CM

## 2015-01-22 LAB — URINE CULTURE

## 2015-01-22 MED ORDER — FOLIC ACID 1 MG PO TABS
1.0000 mg | ORAL_TABLET | Freq: Every day | ORAL | Status: DC
  Administered 2015-01-22 – 2015-01-27 (×6): 1 mg via ORAL
  Filled 2015-01-22 (×6): qty 1

## 2015-01-22 NOTE — Plan of Care (Signed)
Problem: Discharge Progression Outcomes Goal: Other Discharge Outcomes/Goals Outcome: Progressing Progress to goals: 1. Plans to return home at discharge. 2. Denies pain this shift. 3. VSS;condition stablel. 4. Awaiting bone biopsy results. Reports tolerated first radiation treatment yesterday. Reports just tired from busy day tomorrow. 5. Limited appetite; Denies nausea/vomiting. Tolerating diet. 6. Advised may be OOB with assistance for safety.

## 2015-01-22 NOTE — Consult Note (Signed)
ONCOLOGY follow-up note -  History of present illness:  Overall weakness is same. States her pain is under better control and wants to continue current pain medication regimen. Denies any new side effects from this. She had biopsy yesterday, report is pending. ROS: No fevers or chills. No bleeding symptoms. Eating fair. Exam: Alert and oriented, in no acute distress.             Vitals - 98, 100, 20, 114/82, 95% on room air             HEENT - EOMs intact. No thrush.             Lungs - bilateral good air entry             Abdomen - soft, nontender  Labs: 01/21/15 - calcium 7.8, creatinine 0.56, sodium 139, B-12 elevated, folate level at 4.9, iron study unremarkable except low TIBC and elevated ferritin.   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 She had biopsy done yesterday and report is pending. She started on palliative radiation yesterday, currently denies any new symptoms of cord compression. Pain is under control today, continue current opiate regimen and monitor for pain control and for side effects. Serum folate is low, start folic acid 1 mg by mouth daily. No other new recommendations. Oncology will follow-up once biopsy report is available and make further plan of management.

## 2015-01-22 NOTE — Progress Notes (Signed)
Pt declined getting OOB today- "just tired from such a busy day yesterday." Denies pain or other co's except fatigue.

## 2015-01-22 NOTE — Plan of Care (Addendum)
Problem: Discharge Progression Outcomes Goal: Discharge plan in place and appropriate Individualization:  Outcome: Progressing Individualization of Care  Goal: Other Discharge Outcomes/Goals Outcome: Progressing Pt is a moderate+ fall risk. Pt with limited movement due to pain in pelvis, L hip and lower back. Pt declined repositioning qx2hrs. Patient is able to self-turn.  New IV placed this shift per patient request due to discomfort with AC IV.  Toileting offered every hour with safety checks. Bed alarm activated. Patient has a history of hypothyroidism which is controlled with synthroid and thyroid cancer.   H/o hypothyroidism controlled with synthroid. H/o thyroid cancer. Pt reported mild pain, declined an intervention. Protein electro 24hr urine collection completed at 0220 7/16 and sent to lab.  BP 110/66 mmHg  Pulse 106  Temp(Src) 98.1 F (36.7 C) (Oral)  Resp 23  Ht 5\' 3"  (1.6 m)  Wt 95 lb (43.092 kg)  BMI 16.83 kg/m2  SpO2 95%  LMP 11/15/2014 (Exact Date)

## 2015-01-22 NOTE — Progress Notes (Signed)
[place] at [facility]   PATIENT NAME: Denise Kaufman    MR#:  [id number]  DATE OF BIRTH:  07/15/1960  SUBJECTIVE: And 54 year old female with history of papillary thyroid carcinoma status post treatment in 1998  with surgery, radiation comes in because of fall lower back pain and weight loss and fatigue. Found to have multiple lytic lesions in thoracic,*numb and ribs. Patient says that she is been using crutches for the past few weeks. Visiting a friend from [place]. Started on Decadron by the oncologist today she feels better and feels like she is more energetic. Had a lot of questions about her treatment plan and diagnostic workup. Went for MRI of the brain which showed skull lesions but no brain lesions.    CHIEF COMPLAINT:   Chief Complaint  Patient presents with  . Weakness   patient is resting comfortably. Status post biopsy yesterday. Pain is tolerable with current regimen.  REVIEW OF SYSTEMS:   Review of Systems  Constitutional: Negative for fever and chills.  HENT: Negative for hearing loss.   Eyes: Negative for blurred vision, double vision and photophobia.  Respiratory: Negative for cough, hemoptysis and shortness of breath.   Cardiovascular: Negative for palpitations, orthopnea and leg swelling.  Gastrointestinal: Negative for vomiting, abdominal pain and diarrhea.  Genitourinary: Negative for dysuria and urgency.  Musculoskeletal: Negative for myalgias and neck pain.  Skin: Negative for rash.  Neurological: Negative for dizziness, focal weakness, seizures, weakness and headaches.  Psychiatric/Behavioral: Negative for memory loss. The patient does not have insomnia.     DRUG ALLERGIES:  No Known Allergies  VITALS:  Blood pressure 114/82, pulse 100, temperature 98 F (36.7 C), temperature source Oral, resp. rate 20, height 5' 3"  (1.6 m), weight 43.092 kg (95 lb), last menstrual period 11/15/2014, SpO2 95 %.  PHYSICAL EXAMINATION:   GENERAL:  54 y.o.-year-old patient lying in the bed cachectic and pale looking. EYES: Pupils equal, round, reactive to light and accommodation. No scleral icterus. Extraocular muscles intact.  HEENT: Head atraumatic, normocephalic. Oropharynx and nasopharynx clear.  NECK:  Supple, no jugular venous distention. No thyroid enlargement, no tenderness.  LUNGS: Normal breath sounds bilaterally, no wheezing, rales,rhonchi or crepitation. No use of accessory muscles of respiration.  CARDIOVASCULAR: S1, S2 normal. No murmurs, rubs, or gallops.  ABDOMEN: Soft, nontender, nondistended. Bowel sounds present. No organomegaly or mass.  EXTREMITIES: No pedal edema, cyanosis, or clubbing. Right iliac biopsy site is intact NEUROLOGIC: Cranial nerves II through XII are intact. Muscle strength 5/5 in all extremities. Sensation intact. Gait not checked.  PSYCHIATRIC: The patient is alert and oriented x 3.  SKIN: No obvious rash, lesion, or ulcer.    LABORATORY PANEL:   CBC  Recent Labs Lab 01/20/15 0236  WBC 11.4*  HGB 9.8*  HCT 30.0*  PLT 302   ------------------------------------------------------------------------------------------------------------------  Chemistries   Recent Labs Lab 01/20/15 0236 01/21/15 0422  NA 139 139  K 4.1 4.5  CL 109 110  CO2 21* 22  GLUCOSE 95 151*  BUN 11 10  CREATININE 0.64 0.56  CALCIUM 8.3* 7.8*  AST 54*  --   ALT 31  --   ALKPHOS 1534*  --   BILITOT 0.5  --    ------------------------------------------------------------------------------------------------------------------  Cardiac Enzymes No results for input(s): TROPONINI in the last 168 hours. ------------------------------------------------------------------------------------------------------------------  RADIOLOGY:  Mr Kizzie Fantasia Contrast  01/21/2015   CLINICAL DATA:  Headaches and dizziness. Left-sided weakness. Remote history of thyroid cancer. Diffuse skeletal  abnormalities with lytic  lesions.  EXAM: MRI HEAD WITHOUT AND WITH CONTRAST  TECHNIQUE: Multiplanar, multiecho pulse sequences of the brain and surrounding structures were obtained without and with intravenous contrast.  CONTRAST:  71m MULTIHANCE GADOBENATE DIMEGLUMINE 529 MG/ML IV SOLN  COMPARISON:  CT of the chest and abdomen 01/19/2015  FINDINGS: Mild periventricular white matter changes are somewhat advanced for age. No acute infarct, hemorrhage, or mass lesion is present.  Flow is present in the major intracranial arteries. The globes and orbits are intact. The paranasal sinuses are clear. Minimal fluid is present in the right mastoid air cells. No obstructing nasopharyngeal lesion is present.  Postcontrast images demonstrate no pathologic enhancement of the brain. Multiple skull lesions are noted. A high a posterior left parietal lesion measures 1.6 x 2.3 x 1.9 cm. Multiple other smaller lesions are present without mass effect.  A lesion in the clivus extends to the skullbase bilaterally, right greater than left. This measured 15 x 31 mm on sagittal images just to the right of midline. There is confluent osseous signal abnormality at C2 and C3. This results in at least moderate central canal stenosis at the C2-3 level.  IMPRESSION: 1. Mild age advanced atrophy without evidence for metastatic disease to the brain. 2. Multiple enhancing lesions of the calvarium compatible with multiple myeloma or metastatic disease. 3. Marked signal abnormality and enhancement at the inferior clivus and extending through the upper cervical spine with moderate to severe central canal stenosis at C2-3. This may result in the patient's symptoms.   Electronically Signed   By: CSan MorelleM.D.   On: 01/21/2015 10:56   Ct Biopsy  01/21/2015   CLINICAL DATA:  Multiple destructive bone lesions concerning for metastatic disease.  EXAM: CT GUIDED core BIOPSY OF right iliac bone lesion.  ANESTHESIA/SEDATION: 1.5  Mg IV Versed; 50 mcg IV Fentanyl  Total  Moderate Sedation Time: 16 minutes.  PROCEDURE: The procedure risks, benefits, and alternatives were explained to the patient. Questions regarding the procedure were encouraged and answered. The patient understands and consents to the procedure.  The right hip region was prepped with chlorhexidinein a sterile fashion, and a sterile drape was applied covering the operative field. Sterile gloves were used for the procedure. Local anesthesia was provided with 1% Lidocaine.  Under CT guidance, 17 gauge guiding needle was directed toward lesion in anterior portion right iliac wing. Four core samples were obtained using an 18 gauge biopsy needle. These were placed in formalin vial and delivered to pathology. Needle was removed and appropriate dressing was applied.  Complications: None immediate.  FINDINGS: Multiple lytic lesions are seen throughout the visualized skeleton consistent with osseous metastatic disease.  IMPRESSION: Under CT guidance, percutaneous biopsy of metastatic lesion in right iliac bone.   Electronically Signed   By: JMarijo Conception M.D.   On: 01/21/2015 16:46    EKG:   Orders placed or performed during the hospital encounter of 01/19/15  . ED EKG  . ED EKG  . EKG 12-Lead  . EKG 12-Lead    ASSESSMENT AND PLAN:   #1. 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 Unable to exclude   multiple myeloma. Patient scheduled to have right  ileac bone  biopsy. MRI of the brain showed  Skull lesion. SPEP, UPEP. 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 Started on Decadron 4 mg every  6 hours.  #2 UTI: Continue Rocephin. ,urine Cultures did not show any growth.  #3 generalized weakness likely secondary to #1 DC IV fluids,  Consult physical therapy   All the records are reviewed and case discussed with Care  Management/Social Workerr. Management plans discussed with the patient, family and they are in agreement.  CODE STATUS: Full  TOTAL TIME TAKING CARE OF THIS PATIENT: 62mnutes.   POSSIBLE D/C IN 1-2 DAYS, DEPENDING ON CLINICAL CONDITION.   GNicholes MangoM.D on 01/22/2015 at 2:06 PM  Between 7am to 6pm - Pager - [phone]  After 6pm go to [email or web address] - password [email or web address] [facility]  Office  [phone] CC: Primary care physician; No primary care provider on file.

## 2015-01-23 LAB — PROTEIN ELECTRO, RANDOM URINE
Albumin ELP, Urine: 100 %
Alpha-1-Globulin, U: 0 %
Alpha-2-Globulin, U: 0 %
Beta Globulin, U: 0 %
Gamma Globulin, U: 0 %
TOTAL PROTEIN, URINE-UPE24: 6.8 mg/dL

## 2015-01-23 LAB — KAPPA/LAMBDA LIGHT CHAINS
Kappa free light chain: 28.34 mg/L — ABNORMAL HIGH (ref 3.30–19.40)
Kappa, lambda light chain ratio: 1.35 (ref 0.26–1.65)
Lambda free light chains: 21.03 mg/L (ref 5.71–26.30)

## 2015-01-23 LAB — GLUCOSE, CAPILLARY: Glucose-Capillary: 121 mg/dL — ABNORMAL HIGH (ref 65–99)

## 2015-01-23 LAB — CANCER ANTIGEN 27.29: CA 27.29: 538 U/mL — ABNORMAL HIGH (ref 0.0–38.6)

## 2015-01-23 MED ORDER — MORPHINE SULFATE 2 MG/ML IJ SOLN
2.0000 mg | Freq: Four times a day (QID) | INTRAMUSCULAR | Status: DC | PRN
  Administered 2015-01-24 – 2015-01-27 (×4): 2 mg via INTRAVENOUS
  Filled 2015-01-23 (×4): qty 1

## 2015-01-23 NOTE — Plan of Care (Signed)
Problem: Discharge Progression Outcomes Goal: Discharge plan in place and appropriate Individualization:  Outcome: Progressing Individualization of Care  Goal: Other Discharge Outcomes/Goals Outcome: Progressing Pt is a moderate+ fall risk. Pt with limited movement due to pain in pelvis, L hip and lower back. Pt declined repositioning qx2hrs. Patient is able to self-turn. Patient education provided this shift regarding skin breakdown and importance of turning every two hours while in bed and frequent repositioning.  Toileting offered every hour with safety checks. Bed alarm activated. Patient has a history of hypothyroidism which is controlled with synthroid and thyroid cancer.  H/o hypothyroidism controlled with synthroid. H/o thyroid cancer.    Progress to goals: 1. Plans to return home at discharge. 2. Patient without complaints of pain this shift.    Limited activity due to exhaustion from day before and inability to rest.  3. Condition stable.  BP 126/89 mmHg  Pulse 107  Temp(Src) 97.8 F (36.6 C) (Oral)  Resp 20  Ht 5\' 3"  (1.6 m)  Wt 95 lb (43.092 kg)  BMI 16.83 kg/m2  SpO2 96%  LMP 11/15/2014 (Exact Date) 4. Limited appetite; Denies nausea/vomiting. Tolerating diet.  Ate [employer] doughnuts provided by friend this evening. 6. Advised may be OOB with assistance for safety.

## 2015-01-23 NOTE — Evaluation (Signed)
Physical Therapy Evaluation Patient Details Name: Denise Kaufman MRN: [id number] DOB: [birth year] Today's Date: 01/23/2015   History of Present Illness  Pt is a 54yo white female c history of thyroid CA (1998) who has been experiencing a gradual decline in mobility accompanied by chronic LBP and weakness. PT wa sin last week for a biopsy of lesion in pelvis. Pt has been using AC since spetember as mobilty has become more difficult.   Clinical Impression  Pt is received sidelying in bed upon entry, awake, alert, and willing to participate. Pt is A&Ox3 and pleasant, but very anxious about current medical state and awaiting imaging study results. Pt strength as screened by MMT with notable deficits in bilat ankle DF and bilat hip flexion, generally weak in upper quarters (4/5), and with bilat foot drop L>R/steppage gait as noted during functional mobility assessment. Pt also presenting c significant changes to thoracic and cervical spine posture uncharacteristic for age. Pt falls risk is high as evidenced by slow, labored gait and poor standing balance.Patient presenting with impairment of strength, range of motion, balance, and activity tolerance, limiting ability to perform ADL and mobility tasks at  baseline level of function. Patient will benefit from skilled intervention to address the above impairments and limitations, in order to restore to prior level of function, improve patient safety upon discharge, and to decrease falls risk.       Follow Up Recommendations SNF;Supervision for mobility/OOB    Equipment Recommendations  None recommended by PT (pediatric RW)    Recommendations for Other Services       Precautions / Restrictions Precautions Precautions: Fall Restrictions Weight Bearing Restrictions: No      Mobility  Bed Mobility Overal bed mobility: Modified Independent             General bed mobility comments: Labored and with moderate effort to perform.    Transfers Overall transfer level: Needs assistance Equipment used: Rolling walker (2 wheeled) Transfers: Sit to/from Stand Sit to Stand: Min guard         General transfer comment: Very labored, requreing mod-max effort to perform and MinA fromPT.   Ambulation/Gait Ambulation/Gait assistance: Min guard Ambulation Distance (Feet): 70 Feet Assistive device: Rolling walker (2 wheeled)     Gait velocity interpretation: Below normal speed for age/gender General Gait Details: Pt remains in significant kyphosis c inability to pick head up during amb, bilateral foot drop and mild steppage gait, limited by B weakness in hip flexors. Pt scuffs R foot at inittial contact indicative of poor control c walking. B knees remain somewhat flexed throughout entire gait cycle, never achieving TKE.  Stairs Stairs:  (Not appropriate at this time, awaiting updated from imaging. based on entrance to home, pt woudl need to be able to perform stairs ascending from seat position, which seems difficult granted pt transfer status. )          Wheelchair Mobility    Modified Rankin (Stroke Patients Only)       Balance Overall balance assessment: Modified Independent                             High Level Balance Comments: unable to evaluate due to significant impairment of strength in BLE.              Pertinent Vitals/Pain Pain Assessment: Faces Faces Pain Scale: Hurts a little bit Pain Location: Low back.  Pain Descriptors / Indicators: Tightness;Radiating Pain Intervention(s): Monitored  during session;Limited activity within patient's tolerance    Home Living Family/patient expects to be discharged to:: Skilled nursing facility (Pt initally planning on staying c friend, but likely unable to enter/.exit safely for frequent upcoming radiation therapy. ) Living Arrangements: Non-relatives/Friends Available Help at Discharge: Friend(s) (Friend is a Education officer, museum, available for  Summer. ) Type of Home: House Home Access: Stairs to enter Entrance Stairs-Rails: None Entrance Stairs-Number of Steps: 8 Home Layout: One level Home Equipment: Crutches;Walker - 2 wheels Additional Comments: Using standard AC, which are too tall for pt.     Prior Function Level of Independence: Needs assistance   Gait / Transfers Assistance Needed: Has been unable to enter exit friends reisdence s significant effort, hense has been household AMB only for several months now.   ADL's / Homemaking Assistance Needed: some assistance from friend.         Hand Dominance   Dominant Hand: Right    Extremity/Trunk Assessment   Upper Extremity Assessment: Generalized weakness (MMT reveals strong grip and 4/5 throughout BUE otherwise. )           Lower Extremity Assessment: Generalized weakness;LLE deficits/detail;RLE deficits/detail RLE Deficits / Details: 4/5 hip flexion, 3+/5 ankle DF, quads 5/5, hip ER 5/5.  LLE Deficits / Details: Pt is generally weak on LLE as screened by MMT. HipF 3+/5, ankle DF2/5, Quds 5/5, Hip ER 5/5.   Cervical / Trunk Assessment: Other exceptions  Communication   Communication: No difficulties  Cognition Arousal/Alertness: Awake/alert Behavior During Therapy: WFL for tasks assessed/performed Overall Cognitive Status: Within Functional Limits for tasks assessed                      General Comments      Exercises        Assessment/Plan    PT Assessment Patient needs continued PT services  PT Diagnosis Difficulty walking;Abnormality of gait;Generalized weakness   PT Problem List Decreased strength;Decreased range of motion;Decreased activity tolerance;Decreased balance;Decreased mobility;Decreased coordination;Decreased knowledge of use of DME  PT Treatment Interventions DME instruction;Gait training;Stair training;Therapeutic activities;Functional mobility training;Therapeutic exercise;Balance training;Patient/family education   PT  Goals (Current goals can be found in the Care Plan section) Acute Rehab PT Goals Patient Stated Goal: Improve general strength and regain mobility.  PT Goal Formulation: With patient Time For Goal Achievement: 02/06/15 Potential to Achieve Goals: Fair    Frequency Min 2X/week   Barriers to discharge Inaccessible home environment;Decreased caregiver support 8 steps s railing.     Co-evaluation               End of Session Equipment Utilized During Treatment: Gait belt Activity Tolerance: Patient tolerated treatment well;Treatment limited secondary to medical complications (Comment) (Limiting ambuation due to combined increase in GRF and insufficient information regading potential bony mets in spine and pelvis. Will await updates from oncology, uncluding any changes to weight bearing status. ) Patient left: in bed;with bed alarm set;with call bell/phone within reach Nurse Communication: Mobility status;Precautions;Weight bearing status         Time: 2951-8841 PT Time Calculation (min) (ACUTE ONLY): 30 min   Charges:   PT Evaluation $Initial PT Evaluation Tier I: 1 Procedure PT Treatments $Self Care/Home Management: 8-22   PT G Codes:        Buccola,Allan C 02-03-15, 5:50 PM  5:55 PM  Etta Grandchild, PT, DPT [place] License # [id number]

## 2015-01-23 NOTE — Progress Notes (Signed)
Upon nurse entering room to administer meds, pt sitting up in bed crying. Friend at bedside. Emotional support provided and encouraged pt to express feelings. Advised chaplain available prn; declined intervention. Pt remains in bed.

## 2015-01-23 NOTE — Progress Notes (Signed)
[place] at [facility]   PATIENT NAME: Denise Kaufman    MR#:  [id number]  DATE OF BIRTH:  July 10, 1960  SUBJECTIVE: And 54 year old female with history of papillary thyroid carcinoma status post treatment in 1998  with surgery, radiation comes in because of fall lower back pain and weight loss and fatigue. Found to have multiple lytic lesions in thoracic,*numb and ribs. Patient says that she is been using crutches for the past few weeks. Visiting a friend from [place]. Started on Decadron by the oncologist today she feels better and feels like she is more energetic. Had a lot of questions about her treatment plan and diagnostic workup. Went for MRI of the brain which showed skull lesions but no brain lesions.    CHIEF COMPLAINT:   Chief Complaint  Patient presents with  . Weakness   patient is resting comfortably. Status post biopsy. Pain is tolerable with current regimen. Lives with friend and not sure whether she could go to her friend's place or not.  REVIEW OF SYSTEMS:   Review of Systems  Constitutional: Negative for fever and chills.  HENT: Negative for hearing loss.   Eyes: Negative for blurred vision, double vision and photophobia.  Respiratory: Negative for cough, hemoptysis and shortness of breath.   Cardiovascular: Negative for palpitations, orthopnea and leg swelling.  Gastrointestinal: Negative for vomiting, abdominal pain and diarrhea.  Genitourinary: Negative for dysuria and urgency.  Musculoskeletal: Negative for myalgias and neck pain.  Skin: Negative for rash.  Neurological: Negative for dizziness, focal weakness, seizures, weakness and headaches.  Psychiatric/Behavioral: Negative for memory loss. The patient does not have insomnia.     DRUG ALLERGIES:  No Known Allergies  VITALS:  Blood pressure 119/82, pulse 99, temperature 98.2 F (36.8 C), temperature source Oral, resp. rate 20, height 5' 3"  (1.6 m), weight 43.092 kg (95  lb), last menstrual period 11/15/2014, SpO2 92 %.  PHYSICAL EXAMINATION:  GENERAL:  54 y.o.-year-old patient lying in the bed cachectic and pale looking. EYES: Pupils equal, round, reactive to light and accommodation. No scleral icterus. Extraocular muscles intact.  HEENT: Head atraumatic, normocephalic. Oropharynx and nasopharynx clear.  NECK:  Supple, no jugular venous distention. No thyroid enlargement, no tenderness.  LUNGS: Normal breath sounds bilaterally, no wheezing, rales,rhonchi or crepitation. No use of accessory muscles of respiration.  CARDIOVASCULAR: S1, S2 normal. No murmurs, rubs, or gallops.  ABDOMEN: Soft, nontender, nondistended. Bowel sounds present. No organomegaly or mass.  EXTREMITIES: No pedal edema, cyanosis, or clubbing. Right iliac biopsy site is intact NEUROLOGIC: Cranial nerves II through XII are intact. Muscle strength 5/5 in all extremities. Sensation intact. Gait not checked.  PSYCHIATRIC: The patient is alert and oriented x 3.  SKIN: No obvious rash, lesion, or ulcer.    LABORATORY PANEL:   CBC  Recent Labs Lab 01/20/15 0236  WBC 11.4*  HGB 9.8*  HCT 30.0*  PLT 302   ------------------------------------------------------------------------------------------------------------------  Chemistries   Recent Labs Lab 01/20/15 0236 01/21/15 0422  NA 139 139  K 4.1 4.5  CL 109 110  CO2 21* 22  GLUCOSE 95 151*  BUN 11 10  CREATININE 0.64 0.56  CALCIUM 8.3* 7.8*  AST 54*  --   ALT 31  --   ALKPHOS 1534*  --   BILITOT 0.5  --    ------------------------------------------------------------------------------------------------------------------  Cardiac Enzymes No results for input(s): TROPONINI in the last 168 hours. ------------------------------------------------------------------------------------------------------------------  RADIOLOGY:  Ct Biopsy  01/21/2015   CLINICAL DATA:  Multiple destructive bone lesions concerning for metastatic  disease.  EXAM: CT GUIDED core BIOPSY OF right iliac bone lesion.  ANESTHESIA/SEDATION: 1.5  Mg IV Versed; 50 mcg IV Fentanyl  Total Moderate Sedation Time: 16 minutes.  PROCEDURE: The procedure risks, benefits, and alternatives were explained to the patient. Questions regarding the procedure were encouraged and answered. The patient understands and consents to the procedure.  The right hip region was prepped with chlorhexidinein a sterile fashion, and a sterile drape was applied covering the operative field. Sterile gloves were used for the procedure. Local anesthesia was provided with 1% Lidocaine.  Under CT guidance, 17 gauge guiding needle was directed toward lesion in anterior portion right iliac wing. Four core samples were obtained using an 18 gauge biopsy needle. These were placed in formalin vial and delivered to pathology. Needle was removed and appropriate dressing was applied.  Complications: None immediate.  FINDINGS: Multiple lytic lesions are seen throughout the visualized skeleton consistent with osseous metastatic disease.  IMPRESSION: Under CT guidance, percutaneous biopsy of metastatic lesion in right iliac bone.   Electronically Signed   By: Marijo Conception, M.D.   On: 01/21/2015 16:46    EKG:   Orders placed or performed during the hospital encounter of 01/19/15  . ED EKG  . ED EKG  . EKG 12-Lead  . EKG 12-Lead    ASSESSMENT AND PLAN:   #1. 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 Unable to exclude   multiple myeloma. Patient scheduled to have right  ileac bone  biopsy. MRI of the brain showed  Skull lesion. SPEP, UPEP. 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 Started on Decadron 4 mg every 6 hours.  #2 UTI: IDISContinue Rocephin. ,urine Cultures  with mixed bacteria  #3 generalized weakness  likely secondary to #1 DC IV fluids,  Consult physical therapy  is pending   disposition based on PT recommendations    All the records are reviewed and case discussed with Care Management/Social Workerr. Management plans discussed with the patient, family and they are in agreement.  CODE STATUS: Full  TOTAL TIME TAKING CARE OF THIS PATIENT: 59mnutes.   POSSIBLE D/C IN AM DEPENDING ON CLINICAL CONDITION.   GNicholes MangoM.D on 01/23/2015 at 1:09 PM  Between 7am to 6pm - Pager - [phone]  After 6pm go to [email or web address] - password [email or web address]ospital[facility]  Office  [phone] CC: Primary care physician; No primary care provider on file.

## 2015-01-23 NOTE — Plan of Care (Signed)
Problem: Discharge Progression Outcomes Goal: Other Discharge Outcomes/Goals Progress to goals: 1. Pt unsure regarding discharge plans. States she can stay with a friend but there are steps in and out of house with concern about her being able to navigate them with crutches; also may need to reconsider rehab resources. 2. Denies pain; declines need for pain prn's with multiple offers. 3. VSS. Condition stable with no new changes. 4. Constipation relieved by MOM x 1 dose today. 5. Tolerating diet with limited appetite; eating small amounts/snacks. 6.  Remained at bedrest despite education and encouragement/offers to get OOB. Observed to lie in primarily same position supine with slight tilt to left. Ask PT to come back this afternoon. Denies pain reason; states "just tired' and wanted laxative results to be done.

## 2015-01-23 NOTE — Plan of Care (Signed)
Problem: Acute Rehab PT Goals(only PT should resolve) Goal: Pt Will Ambulate Pt will ambulate with PRW at Supervision using a step-through pattern and equal step length for a distances greater than 136ft to demonstrate the ability to perform safe household distance ambulation at discharge.    Goal: Pt/caregiver will Perform Home Exercise Program Pt will demonstrate independence in HEP in bed to improve strength and postural deficits at DC.

## 2015-01-24 DIAGNOSIS — C7951 Secondary malignant neoplasm of bone: Secondary | ICD-10-CM | POA: Diagnosis not present

## 2015-01-24 LAB — PROTEIN ELECTROPHORESIS, SERUM
A/G Ratio: 0.8 (ref 0.7–1.7)
Albumin ELP: 2.2 g/dL — ABNORMAL LOW (ref 2.9–4.4)
Alpha-1-Globulin: 0.3 g/dL (ref 0.0–0.4)
Alpha-2-Globulin: 0.4 g/dL (ref 0.4–1.0)
Beta Globulin: 0.8 g/dL (ref 0.7–1.3)
Gamma Globulin: 1.4 g/dL (ref 0.4–1.8)
Globulin, Total: 2.9 g/dL (ref 2.2–3.9)
Total Protein ELP: 5.1 g/dL — ABNORMAL LOW (ref 6.0–8.5)

## 2015-01-24 LAB — UPEP/TP, 24-HR URINE
Albumin, U: 11.9 %
Alpha 1, Urine: 5 %
Alpha 2, Urine: 12.4 %
Beta, Urine: 63.3 %
Gamma Globulin, Urine: 7.4 %
Total Protein, Urine-Ur/day: 117 mg/24 hr (ref 30.0–150.0)
Total Protein, Urine: 9 mg/dL
Total Volume: 1300

## 2015-01-24 NOTE — Plan of Care (Signed)
Problem: Discharge Progression Outcomes Goal: Other Discharge Outcomes/Goals Outcome: Progressing Order issued to DC pt to SNF, care management notified and states " it could take a couple days" to get pt a bed Appetite fair, but pt is frail with recent weight loss ( shakes added to trays) Pt went down for radiation treatment today Pending PT consult.

## 2015-01-24 NOTE — Clinical Social Work Note (Signed)
Clinical Social Work Assessment  Patient Details  Name: Denise Kaufman MRN: [id number] Date of Birth: 1961/04/05  Date of referral:  01/24/15               Reason for consult:  Facility Placement                Permission sought to share information with:  Other (Friend) Permission granted to share information::  Yes, Verbal Permission Granted  Name::      [name], local friend)   Housing/Transportation Living arrangements for the past 2 months:  [place] of Information:  Patient, Friend/Neighbor Patient Interpreter Needed:  None Criminal Activity/Legal Involvement Pertinent to Current Situation/Hospitalization:  No - Comment as needed Significant Relationships:  Friend Lives with:  Pets Do you feel safe going back to the place where you live?  Yes Need for family participation in patient care:  No (Coment)  Care giving concerns:  Continue to assess   Social Worker assessment / plan:  CSW met with pt to address consult. CSW introduced herself and explained role of social work. CSW also explained the process of discharging to a SNF, as recommended by PT. CSW addressed potential barriers:  Facility that will accept pt's insurance, insurance approval, if the facilities are able to manage care, and out of state PASARR. CSW also discussed transportation to and from the [facility] for treatment.   Pt is a [place] resident. Pt was admitted while visiting a friend locally. Pt has a remote history of Thyroid Cancer and was seen at [facility], however there is no current treatment and is now only doing follow ups. Pt shared that she has made the oncologist aware of this. PT is recommending SNF. Pt is agreeable.   CSW addressed the various options at discharge, such as home health services.   CSW completed FL2 and placed it on the chart for MD's signature. CSW initiated SNF search. CSW will follow up with bed offers. CSW will continue to follow.   Employment status:  Other  (Comment) Insurance information:  Public librarian) PT Recommendations:  [place] / Referral to community resources:  [place]  Patient/Family's Response to care:  Pt was very pleasant and appreciative of CSW support.   Patient/Family's Understanding of and Emotional Response to Diagnosis, Current Treatment, and Prognosis: Pt and friend understand that there are barriers to SNF placement.   Emotional Assessment Appearance:  Appears stated age Attitude/Demeanor/Rapport:  Other (Pleasant) Affect (typically observed):  Accepting, Adaptable Orientation:  Oriented to Self, Oriented to Place, Oriented to  Time, Oriented to Situation Alcohol / Substance use:  Never Used Psych involvement (Current and /or in the community):  No (Comment)  Discharge Needs  Concerns to be addressed:  Adjustment to Illness, Financial / Insurance Concerns Readmission within the last 30 days:  No Current discharge risk:  Chronically ill, Lack of support system Barriers to Discharge:  [place] (Pasarr)   Darden Dates, LCSW 01/24/2015, 5:05 PM

## 2015-01-24 NOTE — Progress Notes (Signed)
[place] at [facility]   PATIENT NAME: Denise Kaufman    MR#:  [id number]  DATE OF BIRTH:  Jan 01, 1961  SUBJECTIVE: And 54 year old female with history of papillary thyroid carcinoma status post treatment in 1998  with surgery, radiation comes in because of fall lower back pain and weight loss and fatigue. Found to have multiple lytic lesions in thoracic,*numb and ribs. Patient says that she is been using crutches for the past few weeks. Visiting a friend from [place]. Started on Decadron by the oncologist today she feels better and feels like she is more energetic. Had a lot of questions about her treatment plan and diagnostic workup. Went for MRI of the brain which showed skull lesions but no brain lesions.    CHIEF COMPLAINT:   Chief Complaint  Patient presents with  . Weakness   patient is resting comfortably. For radiation rx today.Status post biopsy. Pain is tolerable with current regimen.  REVIEW OF SYSTEMS:   Review of Systems  Constitutional: Negative for fever and chills.  HENT: Negative for hearing loss.   Eyes: Negative for blurred vision, double vision and photophobia.  Respiratory: Negative for cough, hemoptysis and shortness of breath.   Cardiovascular: Negative for palpitations, orthopnea and leg swelling.  Gastrointestinal: Negative for vomiting, abdominal pain and diarrhea.  Genitourinary: Negative for dysuria and urgency.  Musculoskeletal: Negative for myalgias and neck pain.  Skin: Negative for rash.  Neurological: Negative for dizziness, focal weakness, seizures, weakness and headaches.  Psychiatric/Behavioral: Negative for memory loss. The patient does not have insomnia.     DRUG ALLERGIES:  No Known Allergies  VITALS:  Blood pressure 128/82, pulse 94, temperature 98.5 F (36.9 C), temperature source Oral, resp. rate 18, height _0  (1.6 m), weight 43.092 kg (95 lb), last menstrual period 11/15/2014, SpO2 96  %.  PHYSICAL EXAMINATION:  GENERAL:  54 y.o.-year-old patient lying in the bed cachectic and pale looking. EYES: Pupils equal, round, reactive to light and accommodation. No scleral icterus. Extraocular muscles intact.  HEENT: Head atraumatic, normocephalic. Oropharynx and nasopharynx clear.  NECK:  Supple, no jugular venous distention. No thyroid enlargement, no tenderness.  LUNGS: Normal breath sounds bilaterally, no wheezing, rales,rhonchi or crepitation. No use of accessory muscles of respiration.  CARDIOVASCULAR: S1, S2 normal. No murmurs, rubs, or gallops.  ABDOMEN: Soft, nontender, nondistended. Bowel sounds present. No organomegaly or mass.  EXTREMITIES: No pedal edema, cyanosis, or clubbing. Right iliac biopsy site is intact NEUROLOGIC: Cranial nerves II through XII are intact. Muscle strength 5/5 in all extremities. Sensation intact. Gait not checked.  PSYCHIATRIC: The patient is alert and oriented x 3.  SKIN: No obvious rash, lesion, or ulcer.    LABORATORY PANEL:   CBC  Recent Labs Lab 01/20/15 0236  WBC 11.4*  HGB 9.8*  HCT 30.0*  PLT 302   ------------------------------------------------------------------------------------------------------------------  Chemistries   Recent Labs Lab 01/20/15 0236 01/21/15 0422  NA 139 139  K 4.1 4.5  CL 109 110  CO2 21* 22  GLUCOSE 95 151*  BUN 11 10  CREATININE 0.64 0.56  CALCIUM 8.3* 7.8*  AST 54*  --   ALT 31  --   ALKPHOS 1534*  --   BILITOT 0.5  --    ------------------------------------------------------------------------------------------------------------------  Cardiac Enzymes No results for input(s): TROPONINI in the last 168 hours. ------------------------------------------------------------------------------------------------------------------  RADIOLOGY:  No results found.  EKG:   Orders placed or performed during the hospital encounter of 01/19/15  . ED EKG  .  ED EKG  . EKG 12-Lead  . EKG  12-Lead    ASSESSMENT AND PLAN:   #1. 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 Unable to exclude   multiple myeloma. Patient had right  ileac bone  biopsy. MRI of the brain showed  Skull lesion. SPEP, UPEP. 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  on Decadron 4 mg every 6 hours.  #2 UTI: IDISContinue Rocephin. ,urine Cultures  with mixed bacteria  #3 generalized weakness likely secondary to #1 DC IV fluids, physical therapy  Is recommending SNF   Disposition to SNF after insurance approval    All the records are reviewed and case discussed with Care Management/Social Workerr. Management plans discussed with the patient, family and they are in agreement.  CODE STATUS: Full  TOTAL TIME TAKING CARE OF THIS PATIENT: 47mnutes.   POSSIBLE D/C IN AM DEPENDING ON CLINICAL CONDITION.   GNicholes MangoM.D on 01/24/2015 at 10:40 PM  Between 7am to 6pm - Pager - [phone] After 6pm go to [email or web address] - password [email or web address] [facility]  Office  [phone] CC: Primary care physician; No primary care provider on file.

## 2015-01-24 NOTE — Progress Notes (Signed)
Nutrition Follow-up  DOCUMENTATION CODES:   Severe malnutrition in context of chronic illness  INTERVENTION:   Meals and snacks: Will adjust snacks to homemade milkshake BID and continue to cater to pt preferences  NUTRITION DIAGNOSIS:   Inadequate oral intake related to chronic illness as evidenced by per patient/family report, improving   GOAL:   Patient will meet greater than or equal to 90% of their needs  Pt meeting nutritional goals with eating 50% of meals and drinking at least one milkshake  MONITOR:    (Energy Intake, Anthropometerics)  REASON FOR ASSESSMENT:   Malnutrition Screening Tool    ASSESSMENT:   Pt admitted with back pain and weakness secondary to lytic bone lesion of hip. Pt with h/o thyroid cancer s/p radiation.   Current Nutrition: eating about 50% of meals per pt   Last BM: 7/17   Medications:reviewed  Electrolyte/Renal Profile and Glucose Profile:   Recent Labs Lab 01/19/15 1641 01/20/15 0236 01/21/15 0422  NA 136 139 139  K 3.5 4.1 4.5  CL 110 109 110  CO2 16* 21* 22  BUN 13 11 10   CREATININE 0.75 0.64 0.56  CALCIUM 8.1* 8.3* 7.8*  GLUCOSE 204* 95 151*   Protein Profile:  Recent Labs Lab 01/19/15 1641 01/20/15 0236  ALBUMIN 2.5* 2.3*      Weight Trend since Admission: Filed Weights   01/19/15 1648  Weight: 95 lb (43.092 kg)     Diet Order:  Diet regular Room service appropriate?: Yes; Fluid consistency:: Thin  Skin:  Reviewed, no issues  Last BM:  01/19/2015  Height:   Ht Readings from Last 1 Encounters:  01/19/15 5\' 3"  (1.6 m)    Weight:   Wt Readings from Last 1 Encounters:  01/19/15 95 lb (43.092 kg)    Ideal Body Weight:  52.3 kg  Wt Readings from Last 10 Encounters:  01/19/15 95 lb (43.092 kg)  01/21/15 95 lb (43.092 kg)    BMI:  Body mass index is 16.83 kg/(m^2).  Estimated Nutritional Needs:   Kcal:  1587-1852kcals, BEE: 1102kcals, TEE: (IF 1.2-1.4)(AF 1.2) using IBW of  52.3kg  Protein:  57-67g protein (1.1-1.3g/kg) using IBW of 52.3kg  Fluid:  1308-1554mL of fluid (25-5mL/kg) using IBW of 52.3kg  EDUCATION NEEDS:   No education needs identified at this time   LOW Care Level Ayuub Penley B. Zenia Resides, [place], [place] (pager)

## 2015-01-24 NOTE — Progress Notes (Signed)
PT Cancellation Note  Patient Details Name: Denise Kaufman MRN: [id number] DOB: 13-Aug-1960   Cancelled Treatment:    Reason Eval/Treat Not Completed: Medical issues which prohibited therapy (Treatment session attempted.  Patient reports "just received morphine shot" and "getting ready to leave for radiation".  Requests therapist re-attempt at later time as available.   Will continue efforts as available, agreeable and medically appropriate)  Faryn Sieg H. Owens Shark, PT, DPT, NCS 01/24/2015, 10:36 AM [phone]

## 2015-01-24 NOTE — Clinical Social Work Placement (Signed)
   CLINICAL SOCIAL WORK PLACEMENT  NOTE  Date:  01/24/2015  Patient Details  Name: Denise Kaufman MRN: [id number] Date of Birth: 1961/06/21  Clinical Social Work is seeking post-discharge placement for this patient at the [place] level of care (*CSW will initial, date and re-position this form in  chart as items are completed):  Yes   Patient/family provided with [facility] Work Department's list of facilities offering this level of care within the geographic area requested by the patient (or if unable, by the patient's family).  Yes   Patient/family informed of their freedom to choose among providers that offer the needed level of care, that participate in Medicare, Medicaid or managed care program needed by the patient, have an available bed and are willing to accept the patient.  Yes   Patient/family informed of [facility]'s ownership interest in [facility] and [facility], as well as of the fact that they are under no obligation to receive care at these facilities.  PASRR submitted to EDS on 01/24/15     PASRR number received on       Existing PASRR number confirmed on       FL2 transmitted to all facilities in geographic area requested by pt/family on 01/24/15     FL2 transmitted to all facilities within larger geographic area on       Patient informed that his/her managed care company has contracts with or will negotiate with certain facilities, including the following:            Patient/family informed of bed offers received.  Patient chooses bed at       Physician recommends and patient chooses bed at      Patient to be transferred to   on  .  Patient to be transferred to facility by       Patient family notified on   of transfer.  Name of family member notified:        PHYSICIAN       Additional Comment:    _______________________________________________ Darden Dates, LCSW 01/24/2015, 5:02 PM

## 2015-01-24 NOTE — Plan of Care (Signed)
Problem: Discharge Progression Outcomes Goal: Other Discharge Outcomes/Goals Outcome: Progressing Pt is a high+ fall risk. Pt with limited movement due to pain in pelvis, L hip and lower back. Pt declined repositioning qx2hrs. Patient is able to self-turn. Patient education provided this shift regarding skin breakdown and importance of turning every two hours while in bed and frequent repositioning. Toileting offered every hour with safety checks. Bed alarm activated. Patient has a history of hypothyroidism which is controlled with synthroid and thyroid cancer.   Progress to goals: 1. Plans to return home at discharge. 2. Patient c/o pain 4/10 x1 this shift.  Oxycodone administered per eMAR.  Pain resolved upon reassessment.  Limited activity this shift due to working with PT on day shift.  Patient stated that she was "sore and exhausted" 3. Condition stable. BP 140/92 mmHg  Pulse 106  Temp(Src) 98.3 F (36.8 C) (Oral)  Resp 20  Ht 5\' 3"  (1.6 m)  Wt 95 lb (43.092 kg)  BMI 16.83 kg/m2  SpO2 97%  LMP 11/15/2014 (Exact Date) 4. Good appetite; Denies nausea/vomiting. Tolerating diet.

## 2015-01-25 ENCOUNTER — Ambulatory Visit
Admit: 2015-01-25 | Discharge: 2015-01-25 | Disposition: A | Discharge status: Home / Self Care | Payer: BLUE CROSS/BLUE SHIELD | Attending: Radiation Oncology | Admitting: Radiation Oncology

## 2015-01-25 DIAGNOSIS — G8194 Hemiplegia, unspecified affecting left nondominant side: Secondary | ICD-10-CM

## 2015-01-25 DIAGNOSIS — C50919 Malignant neoplasm of unspecified site of unspecified female breast: Secondary | ICD-10-CM

## 2015-01-25 DIAGNOSIS — C50911 Malignant neoplasm of unspecified site of right female breast: Secondary | ICD-10-CM

## 2015-01-25 DIAGNOSIS — R630 Anorexia: Secondary | ICD-10-CM

## 2015-01-25 DIAGNOSIS — C7951 Secondary malignant neoplasm of bone: Secondary | ICD-10-CM | POA: Diagnosis not present

## 2015-01-25 DIAGNOSIS — E538 Deficiency of other specified B group vitamins: Secondary | ICD-10-CM

## 2015-01-25 LAB — PTH-RELATED PEPTIDE: PTH-related peptide: <0.74 pmol/L

## 2015-01-25 NOTE — Progress Notes (Signed)
[facility] Hematology/Oncology Progress Note  Date of admission: 01/19/2015  Hospital day:  01/25/2015  Chief Complaint: Denise Kaufman is a 54 y.o. female with a history of papillary thyroid carcinoma who was admitted through the emergency room with weakness and back pain and imaging studies revealing widely metastatic disease.  Subjective: Feeling better today.  Social History: The patient is accompanied by her friend, Denise Kaufman.  She plans on eventually returning to [place] to receive her care after an admission to a local SNF in order to complete her radiation and gain strength.  She requests referral to an oncologist in [place], [place].  Allergies: No Known Allergies  Scheduled Medications: . dexamethasone  4 mg Oral 4 times per day  . folic acid  1 mg Oral Daily  . heparin  5,000 Units Subcutaneous 3 times per day  . levothyroxine  112 mcg Oral QAC breakfast    Review of Systems: GENERAL: Fatigue. No fevers or sweats. Weight loss of 95 pounds in the past 2 years. PERFORMANCE STATUS (ECOG): 2 HEENT: No runny nose, sore throat, mouth sores or tenderness. Lungs: No shortness of breath or cough. No hemoptysis. Cardiac: No chest pain, palpitations, orthopnea, or PND. Breasts: Aware of right breast mass x 1 month. GI: Anorexia. No nausea, diarrhea, constipation, melena or hematochezia. GU: No incontinence. No urgency, frequency, dysuria, or hematuria. Menses off/on. Musculoskeletal: Low back pain. No joint pain. No muscle tenderness. Extremities: No pain or swelling. Skin: No rashes or skin changes. Neuro: Left sided weakness (lower extremity strength most affected), maybe slightly better. No headache, numbness or balance or coordination issues. Endocrine: History of thyroid carcinoma s/p partial thyroidectomy. No diabetes, hot flashes or night sweats. Psych: No mood changes, depression or anxiety. Pain: Low back pain. Review of systems:  All other systems reviewed and found to be negative.  Physical Exam: Blood pressure 131/90, pulse 99, temperature 98.4 F (36.9 C), temperature source Oral, resp. rate 20, height 5\' 3"  (1.6 m), weight 95 lb (43.092 kg), last menstrual period 11/15/2014, SpO2 95 %.  GENERAL: Well developed, well nourished woman lying in bed in street clothes on the medical unit in no acute distress. MENTAL STATUS: Alert and oriented to person, place and time. HEAD: Brown hair with slight graying. Normocephalic, atraumatic, face symmetric, no Cushingoid features. EYES: Blue eyes. No conjunctivitis or scleral icterus. Extraocular movement intact. SKIN: Right upper extremity tattoo. No rashes, ulcers or lesions. EXTREMITIES: Extremities thin. PSYCH: Appropriate.  No results found for this or any previous visit (from the past 48 hour(s)). No results found.  Assessment:  Denise Kaufman is a 54 y.o. female with with 2 year history of 95 pound weight loss, 10 month history of progressive weakness and pain, a 1 month history of right breast mass, and a 10 day history of acute decline in left sided strength. 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   Right iliac biopsy from 01/21/2015 confirmed metastatic breast cancer.  ER, PR, and Her2/neu are pending.  CA27.29 was 538.0 (0-38.6) on 01/21/2015.  She has a normocytic anemia likely multi-factorial and secondary to chronic disease, marrow replacement, and vitamin deficiency secondary to anorexia.  Folate was 4.9 (low).  She is on folic acid 1 mg a day.  Plan: 1. Discuss diagnosis of metastatic breast cancer.  Discuss awaiting ER/PR and Her2/neu testing.  Preliminary disccussion regarding outpatient chemotherapy.  Patient will be returning back home to [place] when able.  I will help to find her a Museum/gallery conservator in  [place], [place].  Multiple questions asked and answered. 2. Discuss plan to complete palliative radiation therapy.  Decadron will be tapered by radiation oncology.  Consider omeprazole for GI prophylaxis.   Lequita Asal, MD  01/25/2015, 11:45 PM

## 2015-01-25 NOTE — Plan of Care (Signed)
Problem: Discharge Progression Outcomes Goal: Other Discharge Outcomes/Goals Outcome: Progressing Pt is a high+ fall risk. Pt with limited movement due to pain in pelvis, L hip and lower back. Pt declined repositioning qx2hrs. Patient is able to self-turn. Patient education provided this shift regarding skin breakdown and importance of turning every two hours while in bed and frequent repositioning. Toileting offered every hour with safety checks. Bed alarm activated.  Patient has a history of hypothyroidism which is controlled with synthroid and thyroid cancer.   Progress to goals: 1. Plans to go to ECF at discharge - in progress. 2. Patient c/o pain 3/10 x1 this shift. Oxycodone administered per eMAR. Pain resolved upon reassessment.  3. Condition stable. BP 128/82 mmHg  Pulse 94  Temp(Src) 98.5 F (36.9 C) (Oral)  Resp 18  Ht 5\' 3"  (1.6 m)  Wt 95 lb (43.092 kg)  BMI 16.83 kg/m2  SpO2 96%  LMP 11/15/2014 (Exact Date) 4. Good appetite this shift.  Requested sandwich plate; Denies nausea/vomiting. Tolerating diet.

## 2015-01-25 NOTE — Progress Notes (Signed)
Physical Therapy Treatment Patient Details Name: Denise Kaufman MRN: [id number] DOB: [birth year] Today's Date: 01/25/2015    History of Present Illness Pt is a 54 yo white female c history of thyroid CA (1998) who has been experiencing a gradual decline in mobility accompanied by chronic LBP and weakness. PT wa sin last week for a biopsy of lesion in pelvis. Pt has been using AC since spetember as mobilty has become more difficult.     PT Comments    Pt was motivated to complete therapy session in order to increase her posture and improve her health. Pt was able to get to sitting without LBP secondary to PT instruction on logrolling in supine, min assist. Pt was able ambulate with RW using min assist for 80 ft with short step length and significant thoracic kyphosis present. Pt cried during session and was upset about her current situation regarding her medical diagnosis but was completely compliant with everything that was asked of her. Pt would benefit from skilled PT in order to improve gait pattern/increase cardiovascular endurance/improve transfers.    Follow Up Recommendations  SNF;Supervision for mobility/OOB     Equipment Recommendations  Rolling walker with 5" wheels    Recommendations for Other Services       Precautions / Restrictions Precautions Precautions: Fall Restrictions Weight Bearing Restrictions: No    Mobility  Bed Mobility Overal bed mobility: Modified Independent             General bed mobility comments: PT instructed patient on logrolling to seated position, demonstrated weel by pt.   Transfers Overall transfer level: Needs assistance Equipment used: Rolling walker (2 wheeled) Transfers: Sit to/from Stand Sit to Stand: Min guard;Min assist            Ambulation/Gait Ambulation/Gait assistance: Min guard;Min assist Ambulation Distance (Feet): 80 Feet Assistive device: Rolling walker (2 wheeled)       General Gait Details: Pt presents  with thoracic kyphosis (which pt knows is present) during ambulation.  Pt presents with L foot drop during gait, compensated by shortened step length and flexed trunk position. Pt lacks hip extension during stance phase which could also shorten step length.    Stairs            Wheelchair Mobility    Modified Rankin (Stroke Patients Only)       Balance                               High Level Balance Comments: unable to evaluate due to significant impairment of strength in BLE.     Cognition Arousal/Alertness: Awake/alert Behavior During Therapy: WFL for tasks assessed/performed (Pt present with a few episodes of crying/anxiety secondar to her medical condition. Pt continued to work hard in during session.) Overall Cognitive Status: Within Functional Limits for tasks assessed                      Exercises Other Exercises Other Exercises: Supine bilat heel slides/ankle pumps/SAQ/leg lifts, 1 x 10 with verbal cuing to keep the core tight with each therex Other Exercises: Seated bilat leg marches, 1 x 10 with verbal cuing to keep the core tight Other Exercises: Seated thoracic extension with hands interlaced behind head/cervical retractions, 10 x 3 sec holds, in response to pt's concerns with kyphotic posture.     General Comments        Pertinent Vitals/Pain Pain Score: 3  (  Pt reports less LBP than usual with movement, was able to complete therex/ambulation without pain restricting her. ) Pain Location: Lower back Pain Descriptors / Indicators: Discomfort;Sharp;Radiating Pain Intervention(s): Monitored during session;Limited activity within patient's tolerance    Home Living                      Prior Function            PT Goals (current goals can now be found in the care plan section) Acute Rehab PT Goals Patient Stated Goal: Improve general strength and regain mobility.  PT Goal Formulation: With patient Time For Goal Achievement:  02/06/15 Potential to Achieve Goals: Fair Progress towards PT goals: Not progressing toward goals - comment    Frequency  Min 2X/week    PT Plan Current plan remains appropriate    Co-evaluation             End of Session Equipment Utilized During Treatment: Gait belt Activity Tolerance: Patient tolerated treatment well;Patient limited by fatigue Patient left: in bed;with bed alarm set;with call bell/phone within reach;with nursing/sitter in room     Time: 9811-9147 PT Time Calculation (min) (ACUTE ONLY): 29 min  Charges:                       G Codes:      Bernestine Amass, SPT 2015-02-01 1:21 PM

## 2015-01-25 NOTE — Progress Notes (Signed)
[place] at [facility]   PATIENT NAME: Denise Kaufman    MR#:  [id number]  DATE OF BIRTH:  17-Nov-1960  SUBJECTIVE: And 54 year old female with history of papillary thyroid carcinoma status post treatment in 1998  with surgery, radiation comes in because of fall lower back pain and weight loss and fatigue. Found to have multiple lytic lesions in thoracic,*numb and ribs. Patient says that she is been using crutches for the past few weeks. Visiting a friend from [place]. Started on Decadron by the oncologist today she feels better and feels like she is more energetic. Had a lot of questions about her treatment plan and diagnostic workup. Went for MRI of the brain which showed skull lesions but no brain lesions.    CHIEF COMPLAINT:   Chief Complaint  Patient presents with  . Weakness   patient is resting comfortably. For radiation rx yesterday and  today.Status post biopsy. Pain is tolerable with current regimen.  REVIEW OF SYSTEMS:   Review of Systems  Constitutional: Negative for fever and chills.  HENT: Negative for hearing loss.   Eyes: Negative for blurred vision, double vision and photophobia.  Respiratory: Negative for cough, hemoptysis and shortness of breath.   Cardiovascular: Negative for palpitations, orthopnea and leg swelling.  Gastrointestinal: Negative for vomiting, abdominal pain and diarrhea.  Genitourinary: Negative for dysuria and urgency.  Musculoskeletal: Negative for myalgias and neck pain.  Skin: Negative for rash.  Neurological: Negative for dizziness, focal weakness, seizures, weakness and headaches.  Psychiatric/Behavioral: Negative for memory loss. The patient does not have insomnia.     DRUG ALLERGIES:  No Known Allergies  VITALS:  Blood pressure 134/80, pulse 105, temperature 98.2 F (36.8 C), temperature source Oral, resp. rate 20, height 5' 3"  (1.6 m), weight 43.092 kg (95 lb), last menstrual period 11/15/2014, SpO2  96 %.  PHYSICAL EXAMINATION:  GENERAL:  54 y.o.-year-old patient lying in the bed cachectic and pale looking. EYES: Pupils equal, round, reactive to light and accommodation. No scleral icterus. Extraocular muscles intact.  HEENT: Head atraumatic, normocephalic. Oropharynx and nasopharynx clear.  NECK:  Supple, no jugular venous distention. No thyroid enlargement, no tenderness.  LUNGS: Normal breath sounds bilaterally, no wheezing, rales,rhonchi or crepitation. No use of accessory muscles of respiration.  CARDIOVASCULAR: S1, S2 normal. No murmurs, rubs, or gallops.  ABDOMEN: Soft, nontender, nondistended. Bowel sounds present. No organomegaly or mass.  EXTREMITIES: No pedal edema, cyanosis, or clubbing. Right iliac biopsy site is intact NEUROLOGIC: Cranial nerves II through XII are intact.  Sensation intact. Gait not checked.  PSYCHIATRIC: The patient is alert and oriented x 3.  SKIN: No obvious rash, lesion, or ulcer.    LABORATORY PANEL:   CBC  Recent Labs Lab 01/20/15 0236  WBC 11.4*  HGB 9.8*  HCT 30.0*  PLT 302   ------------------------------------------------------------------------------------------------------------------  Chemistries   Recent Labs Lab 01/20/15 0236 01/21/15 0422  NA 139 139  K 4.1 4.5  CL 109 110  CO2 21* 22  GLUCOSE 95 151*  BUN 11 10  CREATININE 0.64 0.56  CALCIUM 8.3* 7.8*  AST 54*  --   ALT 31  --   ALKPHOS 1534*  --   BILITOT 0.5  --    ------------------------------------------------------------------------------------------------------------------  Cardiac Enzymes No results for input(s): TROPONINI in the last 168 hours. ------------------------------------------------------------------------------------------------------------------  RADIOLOGY:  No results found.  EKG:   Orders placed or performed during the hospital encounter of 01/19/15  . ED EKG  . ED  EKG  . EKG 12-Lead  . EKG 12-Lead    ASSESSMENT AND PLAN:    #1. 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 Unable to exclude   multiple myeloma. Patient had right  ileac bone  biopsy. MRI of the brain showed  Skull lesion. SPEP, UPEP. 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  on Decadron 4 mg every 6 hours.  #2 UTI: IDISContinue Rocephin. ,urine Cultures  with mixed bacteria  #3 generalized weakness likely secondary to #1  DC IV fluids, physical therapy  Is recommending SNF   Disposition to SNF after insurance approval awaiting passar approval as [place] is out of state    All the records are reviewed and case discussed with Care Management/Social Workerr. Management plans discussed with the patient,  and they are in agreement.  CODE STATUS: Full  TOTAL TIME TAKING CARE OF THIS PATIENT/ d/w rn, cm and sw / coordination of care : 66mnutes.   POSSIBLE D/C IN AM DEPENDING ON CLINICAL CONDITION.   GNicholes MangoM.D on 01/25/2015 at 4:57 PM  Between 7am to 6pm - Pager - [phone] After 6pm go to [email or web address] - password [email or web address] [email or web address]  Office  [phone] CC: Primary care physician; No primary care provider on file.

## 2015-01-25 NOTE — Progress Notes (Signed)
   01/25/15 1330  Clinical Encounter Type  Visited With Patient not available  Attempted to visit with patient but patient was not available and not in her room.  Will attempt to come back at another time.  [place] [phone]

## 2015-01-25 NOTE — Plan of Care (Signed)
Problem: Discharge Progression Outcomes Goal: Other Discharge Outcomes/Goals Plan of care progress to goals: 1. Plans to go to ECF at discharge - in progress. 2. C/o pain after PT and medicated before radiation treatment.  Morphine x1 & Oxycodone x1 provided relief. 3. Hemodynamically: VSS, afebrile 4. Tolerating diet well.  5. Radiation today at cancer center.

## 2015-01-26 MED ORDER — PANTOPRAZOLE SODIUM 40 MG PO TBEC
40.0000 mg | DELAYED_RELEASE_TABLET | Freq: Every day | ORAL | Status: DC
  Administered 2015-01-27: 40 mg via ORAL
  Filled 2015-01-26: qty 1

## 2015-01-26 NOTE — Progress Notes (Signed)
Cancer center called to notify radiation machine not working. Will plan to start back & do radiation tomorrow.

## 2015-01-26 NOTE — Progress Notes (Signed)
[place] at [place] NAME: Denise Kaufman    MR#:  [id number]  DATE OF BIRTH:  Mar 30, 1961  SUBJECTIVE:  CHIEF COMPLAINT:   Chief Complaint  Patient presents with  . Weakness   Continued weakness especially of the neck/trunk  REVIEW OF SYSTEMS:   Review of Systems  Constitutional: Positive for weight loss and malaise/fatigue. Negative for fever.  Respiratory: Negative for shortness of breath.   Cardiovascular: Negative for chest pain and palpitations.  Gastrointestinal: Negative for nausea, vomiting and abdominal pain.  Genitourinary: Negative for dysuria.  Neurological: Positive for weakness.    DRUG ALLERGIES:  No Known Allergies  VITALS:  Blood pressure 108/78, pulse 86, temperature 98.8 F (37.1 C), temperature source Oral, resp. rate 18, height _0  (1.6 m), weight 43.092 kg (95 lb), last menstrual period 11/15/2014, SpO2 93 %.  PHYSICAL EXAMINATION:  GENERAL:  54 y.o.-year-old patient lying in the bed with no acute distress.  EYES: Pupils equal, round, reactive to light and accommodation. No scleral icterus. Extraocular muscles intact.  HEENT: Head atraumatic, normocephalic. Oropharynx and nasopharynx clear. MMM NECK:  Supple, no jugular venous distention. No thyroid enlargement, no tenderness.  LUNGS: Normal breath sounds bilaterally, no wheezing, rales,rhonchi or crepitation. No use of accessory muscles of respiration.  CARDIOVASCULAR: S1, S2 normal. No murmurs, rubs, or gallops.  ABDOMEN: Soft, nontender, nondistended. Bowel sounds present. No organomegaly or mass. No guarding or rebound EXTREMITIES: No pedal edema, cyanosis, or clubbing.  NEUROLOGIC: Cranial nerves II through XII are intact. Muscle strength 5/5 in all extremities. Sensation intact.  PSYCHIATRIC: The patient is alert and oriented x 3.  SKIN: No obvious rash, lesion, or ulcer.    LABORATORY PANEL:   CBC  Recent Labs Lab 01/20/15 0236  WBC  11.4*  HGB 9.8*  HCT 30.0*  PLT 302   ------------------------------------------------------------------------------------------------------------------  Chemistries   Recent Labs Lab 01/20/15 0236 01/21/15 0422  NA 139 139  K 4.1 4.5  CL 109 110  CO2 21* 22  GLUCOSE 95 151*  BUN 11 10  CREATININE 0.64 0.56  CALCIUM 8.3* 7.8*  AST 54*  --   ALT 31  --   ALKPHOS 1534*  --   BILITOT 0.5  --    ------------------------------------------------------------------------------------------------------------------  Cardiac Enzymes No results for input(s): TROPONINI in the last 168 hours. ------------------------------------------------------------------------------------------------------------------  RADIOLOGY:  No results found.  EKG:   Orders placed or performed during the hospital encounter of 01/19/15  . ED EKG  . ED EKG  . EKG 12-Lead  . EKG 12-Lead    ASSESSMENT AND PLAN:   Principal Problem:   Lytic bone lesion of hip Active Problems:   Generalized weakness   UTI (urinary tract infection)   Protein-calorie malnutrition, severe   Bone metastasis   Lytic bone lesions on xray   Weakness of left side of body   Mass of right breast   Breast cancer   Folic acid deficiency   #1. Generalized weakness secondary to widely metastatic disease breast.  Continue to work with PT. Plan for dc to SNF when bed available, she will stay there while completing radiation.  #2 metastatic breast cancer: oncology following.  Continue radiation due to multiple lytic lesions and multi-level cord compression and epidural tumor.  Would like to continue treatment in [place] when able. ER/PR and HER2/neu pending. Decadron per radiation oncology.  #3 UTI: culture with multiple bact species.  Completed rocephin. Recheck UA as initial with TMTC  WBC and she   #4 AOCD: continue folate   CODE STATUS: Full  TOTAL TIME TAKING CARE OF THIS PATIENT: 35 minutes.   Greater than 50%  of time spent in care coordination and counseling.All the records are reviewed and case discussed with Care Management/Social Workerr. Management plans discussed with the patient, family and they are in agreement.  POSSIBLE D/C IN 1-2 DAYS, DEPENDING ON CLINICAL CONDITION.   Myrtis Ser M.D on 01/26/2015 at 11:24 AM  Between 7am to 6pm - Pager - [phone]  After 6pm go to [email or web address] - password [email or web address] [facility]  [place] [facility]  Office  [phone]  CC: Primary care physician; No primary care provider on file.

## 2015-01-26 NOTE — Progress Notes (Signed)
PT Cancellation Note  Patient Details Name: Chelse Matas MRN: [id number] DOB: 12-07-1960   Cancelled Treatment:    Reason Eval/Treat Not Completed: Other (comment) (Treatment session attempted; patient currently with rep from [facility].  Will re-attempt at later time/date as patient available and medically appropriate)   Aleesia Henney H. Owens Shark, PT, DPT, NCS 01/26/2015, 10:50 AM [phone]

## 2015-01-26 NOTE — Plan of Care (Addendum)
Problem: Discharge Progression Outcomes Goal: Other Discharge Outcomes/Goals Plan of care progress to goals: Plan of care progress to goals: 1. Plans to go to ECF at discharge - in progress. 2. C/o pain 2/10 but refused pain medication. Will continue to assess 3. Hemodynamically:   -VSS, afebrile  -pending collection for urine culture. Sample to lab was mislabeled & stated we need to send new specimen 4. Tolerating diet well.   5. No Radiation today at cancer center due to equipment problem. Hopefully to restart radiation treatment tomorrow.

## 2015-01-26 NOTE — Progress Notes (Signed)
Physical Therapy Treatment Patient Details Name: Denise Kaufman MRN: [id number] DOB: Sep 14, 1960 Today's Date: 01/26/2015    History of Present Illness Pt is a 54yo white female c history of thyroid CA (1998) who has been experiencing a gradual decline in mobility accompanied by chronic LBP and weakness. PT wa sin last week for a biopsy of lesion in pelvis. Pt has been using AC since spetember as mobilty has become more difficult.     PT Comments    Patient with improved affect this date.  Able to complete OOB to chair with RW, min assist.  Encouraged for OOB to chair and OOB to [facility] as needed with nursing for improved mobility and activity tolerance (nursing voiced agreement as well).  Mild increase in L LE weakness noted during gait trial; compensates via steppage gait pattern.  May benefit from trial of bilat AFOs at next level of care.   Follow Up Recommendations  SNF;Supervision for mobility/OOB     Equipment Recommendations  Rolling walker with 5" wheels    Recommendations for Other Services       Precautions / Restrictions Precautions Precautions: Fall Restrictions Weight Bearing Restrictions: No    Mobility  Bed Mobility Overal bed mobility: Modified Independent             General bed mobility comments: limited carry-over of instruction regarding log rolling technique from previous session  Transfers Overall transfer level: Needs assistance Equipment used: Rolling walker (2 wheeled) Transfers: Sit to/from Stand Sit to Stand: Min assist         General transfer comment: requires bilat UE support to complete  Ambulation/Gait Ambulation/Gait assistance: Min assist;Mod assist Ambulation Distance (Feet): 85 Feet Assistive device: Rolling walker (2 wheeled)       General Gait Details: completed 50' x2 trials with RW, min/mod assist +1 (second person for chair follow).  Step to gait pattern with steppage gait (L > R); forward trunk flexion, limited postural  extension throughout gait cycle.  No buckling noted; further distance limited by fatigue.   Stairs            Wheelchair Mobility    Modified Rankin (Stroke Patients Only)       Balance                                    Cognition                            Exercises Other Exercises Other Exercises: Seated LE therex, 1x10, AROM for muscular strength/endurance: ankle pumps (act assist L), LAQs, marching, hip abduct/adduct.  Reviewed benefit of OOB to [facility] for toileting with nursing (to promote continued strengthening/mobility); patient/nursing voiced understanding.    General Comments        Pertinent Vitals/Pain Pain Assessment: Faces Faces Pain Scale: Hurts a little bit Pain Location: R shoulder Pain Descriptors / Indicators: Discomfort;Aching Pain Intervention(s): Limited activity within patient's tolerance;Monitored during session;Repositioned    Home Living                      Prior Function            PT Goals (current goals can now be found in the care plan section) Acute Rehab PT Goals Patient Stated Goal: Improve general strength and regain mobility.  PT Goal Formulation: With patient Time For Goal Achievement: 02/06/15  Potential to Achieve Goals: Fair Progress towards PT goals: Progressing toward goals    Frequency  Min 2X/week    PT Plan Current plan remains appropriate    Co-evaluation             End of Session Equipment Utilized During Treatment: Gait belt Activity Tolerance: Patient tolerated treatment well;Patient limited by fatigue Patient left: in chair;with call bell/phone within reach (alarm pad in place, box not available. RN informed/aware of patient position.)     Time: 1535-1558 PT Time Calculation (min) (ACUTE ONLY): 23 min  Charges:  $Gait Training: 8-22 mins $Therapeutic Exercise: 8-22 mins                    G Codes:      Kamryn Messineo H. Owens Shark, PT, DPT, NCS 01/26/2015, 4:22  PM [phone]

## 2015-01-26 NOTE — Plan of Care (Signed)
Problem: Discharge Progression Outcomes Goal: Discharge plan in place and appropriate Individualization:  Outcome: Progressing Pt lives in [place].  Here visiting a friend. Pt will return to [place] after visiting a SNF to finish her radiation.    Goal: Other Discharge Outcomes/Goals Outcome: Progressing Plan of care progress to goal: Pain - pt uses therapy ball to help with back pain, took one oxycodone at bedtime with an ambien to help her sleep Hemodynamically stable - VSS stable Diet - tolerating Activity - pt will call for bathroom assistance

## 2015-01-26 NOTE — Clinical Social Work Note (Signed)
CSW spoke to admissions coordinator of [facility] to inquire about the status of the [place].  Coordinator submitted documentation yesterday and stated he is still waiting to hear back from [facility]  CSW will continue to follow and assist with d/c planning needs.  [employer], [place]

## 2015-01-27 ENCOUNTER — Ambulatory Visit
Admit: 2015-01-27 | Discharge: 2015-01-27 | Disposition: A | Discharge status: Home / Self Care | Payer: BLUE CROSS/BLUE SHIELD | Attending: Radiation Oncology | Admitting: Radiation Oncology

## 2015-01-27 DIAGNOSIS — C7951 Secondary malignant neoplasm of bone: Secondary | ICD-10-CM | POA: Diagnosis not present

## 2015-01-27 LAB — CBC
HCT: 29.3 % — ABNORMAL LOW (ref 35.0–47.0)
HEMOGLOBIN: 9.6 g/dL — AB (ref 12.0–16.0)
MCH: 32.6 pg (ref 26.0–34.0)
MCHC: 32.6 g/dL (ref 32.0–36.0)
MCV: 99.9 fL (ref 80.0–100.0)
Platelets: 197 10*3/uL (ref 150–440)
RBC: 2.94 MIL/uL — AB (ref 3.80–5.20)
RDW: 20.4 % — ABNORMAL HIGH (ref 11.5–14.5)
WBC: 12.5 10*3/uL — AB (ref 3.6–11.0)

## 2015-01-27 LAB — URINALYSIS COMPLETE WITH MICROSCOPIC (ARMC ONLY)
BACTERIA UA: NONE SEEN
Bilirubin Urine: NEGATIVE
GLUCOSE, UA: NEGATIVE mg/dL
Ketones, ur: NEGATIVE mg/dL
NITRITE: NEGATIVE
PH: 7 (ref 5.0–8.0)
Protein, ur: NEGATIVE mg/dL
Specific Gravity, Urine: 1.015 (ref 1.005–1.030)

## 2015-01-27 LAB — BASIC METABOLIC PANEL
Anion gap: 4 — ABNORMAL LOW (ref 5–15)
BUN: 16 mg/dL (ref 6–20)
CALCIUM: 7.3 mg/dL — AB (ref 8.9–10.3)
CO2: 24 mmol/L (ref 22–32)
CREATININE: 0.52 mg/dL (ref 0.44–1.00)
Chloride: 108 mmol/L (ref 101–111)
Glucose, Bld: 117 mg/dL — ABNORMAL HIGH (ref 65–99)
Potassium: 4.9 mmol/L (ref 3.5–5.1)
SODIUM: 136 mmol/L (ref 135–145)

## 2015-01-27 MED ORDER — MAGNESIUM HYDROXIDE 400 MG/5ML PO SUSP: 30.0000 mL | Freq: Every day | ORAL | Status: AC | PRN

## 2015-01-27 MED ORDER — PANTOPRAZOLE SODIUM 40 MG PO TBEC: 40.0000 mg | DELAYED_RELEASE_TABLET | Freq: Every day | ORAL | Status: AC

## 2015-01-27 MED ORDER — DEXAMETHASONE 4 MG PO TABS: 4.0000 mg | ORAL_TABLET | Freq: Four times a day (QID) | ORAL | Status: AC

## 2015-01-27 MED ORDER — OXYCODONE HCL 5 MG PO TABS: 5.0000 mg | ORAL_TABLET | ORAL | Status: AC | PRN

## 2015-01-27 MED ORDER — FOLIC ACID 1 MG PO TABS: 1.0000 mg | ORAL_TABLET | Freq: Every day | ORAL | Status: AC

## 2015-01-27 MED ORDER — ACETAMINOPHEN 325 MG PO TABS: 650.0000 mg | ORAL_TABLET | Freq: Four times a day (QID) | ORAL | Status: AC | PRN

## 2015-01-27 NOTE — Clinical Social Work Note (Signed)
Pt is ready for discharge today to [facility]. [employer] has approved. Pt and friend are agreeable to discharge plan. RN called report. Pt's friend will provide transportation. CSW provided tomorrow's appt information for radiation at the [facility] and updated facility, who will arrange transportation. CSW is signing off as no further needs identified.   Darden Dates, MSW, LCSW Clinical Social Worker  [phone]

## 2015-01-27 NOTE — Discharge Summary (Signed)
[facility] [place] NAME: Denise Kaufman    MR#:  [id number]  DATE OF BIRTH:  1961/05/09  DATE OF ADMISSION:  01/19/2015 ADMITTING PHYSICIAN: Lytle Butte, MD  DATE OF DISCHARGE: 01/27/15  PRIMARY CARE PHYSICIAN: No primary care provider on file.    ADMISSION DIAGNOSIS:  Hypocalcemia [E83.51] Weakness [R53.1] Lytic bone lesions on xray [M89.9] Cystitis [N30.90] Hypothyroidism, unspecified hypothyroidism type [E03.9]  DISCHARGE DIAGNOSIS:  Principal Problem:   Lytic bone lesion of hip Active Problems:   Generalized weakness   UTI (urinary tract infection)   Protein-calorie malnutrition, severe   Bone metastasis   Lytic bone lesions on xray   Mass of right breast   Breast cancer   Folic acid deficiency   SECONDARY DIAGNOSIS:   Past Medical History  Diagnosis Date  . Cancer   . Hypothyroidism complicating pregnancy in third trimester   . Thyroid cancer     Papillary thyroid cancer    HOSPITAL COURSE:    #1. Generalized weakness secondary to widely metastatic disease breast. Continue to work with PT. Plan for dc to SNF today. She will remain at skilled nursing while completing radiation. She has been working very hard with physical therapy here and is making progress.  #2 metastatic breast cancer: oncology following. Continue radiation due to multiple lytic lesions and multi-level cord compression and epidural tumor. She has had treatment today. Would like to continue treatment in [place] when able. ER/PR and HER2/neu pending. Decadron per radiation oncology, please see attached notes. She will need to continue radiation per protocol. She will need to follow-up with oncology, Dr. Mike Gip in 1 week.  #3 UTI: culture with multiple bact species. Completed rocephin. Repeat UA with 0-5 WBC's. No further anti-biotics needed  #4 AOCD: continue folate  DISCHARGE CONDITIONS:    Fair  CONSULTS OBTAINED:  Treatment Team:  Forest Gleason, MD  DRUG ALLERGIES:  No Known Allergies  DISCHARGE MEDICATIONS:   Current Discharge Medication List    START taking these medications   Details  acetaminophen (TYLENOL) 325 MG tablet Take 2 tablets (650 mg total) by mouth every 6 (six) hours as needed for mild pain (or Fever >/= 101). Qty: 30 tablet, Refills: 0    dexamethasone (DECADRON) 4 MG tablet Take 1 tablet (4 mg total) by mouth every 6 (six) hours. Qty: 60 tablet, Refills: 0    folic acid (FOLVITE) 1 MG tablet Take 1 tablet (1 mg total) by mouth daily. Qty: 30 tablet, Refills: 0   Associated Diagnoses: Folic acid deficiency    magnesium hydroxide (MILK OF MAGNESIA) 400 MG/5ML suspension Take 30 mLs by mouth daily as needed for mild constipation. Qty: 360 mL, Refills: 0    oxyCODONE (OXY IR/ROXICODONE) 5 MG immediate release tablet Take 1 tablet (5 mg total) by mouth every 4 (four) hours as needed for moderate pain. Qty: 60 tablet, Refills: 0    pantoprazole (PROTONIX) 40 MG tablet Take 1 tablet (40 mg total) by mouth daily before breakfast. Qty: 30 tablet, Refills: 0      CONTINUE these medications which have NOT CHANGED   Details  levothyroxine (SYNTHROID, LEVOTHROID) 112 MCG tablet Take 112 mcg by mouth daily before breakfast.         DISCHARGE INSTRUCTIONS:    DIET:  Regular diet  DISCHARGE CONDITION:  Fair  ACTIVITY:  Activity as tolerated  OXYGEN:  Home Oxygen: No.   Oxygen Delivery: room air  DISCHARGE LOCATION:  nursing home   If you experience worsening of your admission symptoms, develop shortness of breath, life threatening emergency, suicidal or homicidal thoughts you must seek medical attention immediately by calling 911 or calling your MD immediately  if symptoms less severe.  You Must read complete instructions/literature along with all the possible adverse reactions/side effects for all the Medicines you take and that  have been prescribed to you. Take any new Medicines after you have completely understood and accpet all the possible adverse reactions/side effects.   Please note  You were cared for by a hospitalist during your hospital stay. If you have any questions about your discharge medications or the care you received while you were in the hospital after you are discharged, you can call the unit and asked to speak with the hospitalist on call if the hospitalist that took care of you is not available. Once you are discharged, your primary care physician will handle any further medical issues. Please note that NO REFILLS for any discharge medications will be authorized once you are discharged, as it is imperative that you return to your primary care physician (or establish a relationship with a primary care physician if you do not have one) for your aftercare needs so that they can reassess your need for medications and monitor your lab values.    Today   CHIEF COMPLAINT:   Chief Complaint  Patient presents with  . Weakness    HISTORY OF PRESENT ILLNESS:  Denise Kaufman is a 54 y.o. 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 She describes progressively worsening symptoms over the last 8 weeks with generalized weakness and back pain. However these have been exacerbated and progressively worsening the last 2 week duration after massage. She states that her weakness is most noted on the left side including upper and lower extremity where she states "it feels heavy like there is a gravitational pull". She also describes her back pain mid back as well as lower back worse with movement particularly standing described only as "pain" intensity 6-7 out of 10 nonradiating. She also describes generalized symptoms stating that "I feel like I'm having acidosis attack" and "I feel if there is battery acid or some toxin trying  to leave my body"  Emergency department course: Found to have multiple lytic lesions on imaging.  VITAL SIGNS:  Blood pressure 129/74, pulse 90, temperature 98.8 F (37.1 C), temperature source Oral, resp. rate 18, height _0  (1.6 m), weight 43.092 kg (95 lb), last menstrual period 11/15/2014, SpO2 94 %.  I/O:   Intake/Output Summary (Last 24 hours) at 01/27/15 1536 Last data filed at 01/27/15 1000  Gross per 24 hour  Intake    240 ml  Output   1150 ml  Net   -910 ml    PHYSICAL EXAMINATION:  GENERAL:  54 y.o.-year-old patient lying in the bed with no acute distress.  LUNGS: Normal breath sounds bilaterally, no wheezing, rales,rhonchi or crepitation. No use of accessory muscles of respiration.  CARDIOVASCULAR: S1, S2 normal. No murmurs, rubs, or gallops.  ABDOMEN: Soft, non-tender, non-distended. Bowel sounds present. No organomegaly or mass.  EXTREMITIES: No pedal edema, cyanosis, or clubbing.  NEUROLOGIC: Cranial nerves II through XII are intact. Muscle strength 4+/5 in all extremities. Sensation intact.  PSYCHIATRIC: The patient is alert and oriented x 3.  SKIN: No obvious rash, lesion, or ulcer.   DATA REVIEW:  CBC  Recent Labs Lab 01/27/15 0419  WBC 12.5*  HGB 9.6*  HCT 29.3*  PLT 197    Chemistries   Recent Labs Lab 01/27/15 0419  NA 136  K 4.9  CL 108  CO2 24  GLUCOSE 117*  BUN 16  CREATININE 0.52  CALCIUM 7.3*    Cardiac Enzymes No results for input(s): TROPONINI in the last 168 hours.  Microbiology Results  Results for orders placed or performed during the hospital encounter of 01/19/15  Urine culture     Status: None   Collection Time: 01/19/15  8:30 PM  Result Value Ref Range Status   Specimen Description URINE, RANDOM  Final   Special Requests NONE  Final   Culture   Final    MULTIPLE SPECIES PRESENT, SUGGEST RECOLLECTION IF CLINICALLY INDICATED   Report Status 01/22/2015 FINAL  Final    RADIOLOGY:  No results found.  EKG:    Orders placed or performed during the hospital encounter of 01/19/15  . ED EKG  . ED EKG  . EKG 12-Lead  . EKG 12-Lead      Management plans discussed with the patient, family and they are in agreement.  CODE STATUS:     Code Status Orders        Start     Ordered   01/19/15 2256  Full code   Continuous     01/19/15 2256      TOTAL TIME TAKING CARE OF THIS PATIENT: 35 minutes.  Greater than 50% of time spent in care coordination and counseling.  Myrtis Ser M.D on 01/27/2015 at 3:36 PM  Between 7am to 6pm - Pager - [phone]  After 6pm go to [email or web address] - password [email or web address] [facility]  [facility]  Office  [phone]  CC: Primary care physician; No primary care provider on file.

## 2015-01-27 NOTE — Plan of Care (Signed)
Problem: Discharge Progression Outcomes Goal: Other Discharge Outcomes/Goals Outcome: Progressing Plan of Care Progress to Goal:    Pt report abdominal pain during shift and pain was controlled with IV morphine and PO oxycodone. Pt rested comfortably the rest of the shift. No other signs of distress noted. Will continue to monitor.

## 2015-01-27 NOTE — Progress Notes (Signed)
[facility] [place] NAME: Denise Kaufman    MR#:  [id number]  DATE OF BIRTH:  25-Aug-1960  SUBJECTIVE:  CHIEF COMPLAINT:   Chief Complaint  Patient presents with  . Weakness   Worked with PT yesterday, sore especially in abdomen.  REVIEW OF SYSTEMS:   Review of Systems  Constitutional: Positive for weight loss and malaise/fatigue. Negative for fever.  Respiratory: Negative for shortness of breath.   Cardiovascular: Negative for chest pain and palpitations.  Gastrointestinal: Negative for nausea, vomiting and abdominal pain.  Genitourinary: Negative for dysuria.  Neurological: Positive for weakness.    DRUG ALLERGIES:  No Known Allergies  VITALS:  Blood pressure 129/74, pulse 90, temperature 98.8 F (37.1 C), temperature source Oral, resp. rate 18, height 5' 3"  (1.6 m), weight 43.092 kg (95 lb), last menstrual period 11/15/2014, SpO2 94 %.  PHYSICAL EXAMINATION:  GENERAL:  54 y.o.-year-old patient lying in the bed with no acute distress.  EYES: Pupils equal, round, reactive to light and accommodation. No scleral icterus. Extraocular muscles intact.  HEENT: Head atraumatic, normocephalic. Oropharynx and nasopharynx clear. MMM NECK:  Supple, no jugular venous distention. No thyroid enlargement, no tenderness.  LUNGS: Normal breath sounds bilaterally, no wheezing, rales,rhonchi or crepitation. No use of accessory muscles of respiration.  CARDIOVASCULAR: S1, S2 normal. No murmurs, rubs, or gallops.  ABDOMEN: Soft, nontender, nondistended. Bowel sounds present. No organomegaly or mass. No guarding or rebound EXTREMITIES: No pedal edema, cyanosis, or clubbing.  NEUROLOGIC: Cranial nerves II through XII are intact. Muscle strength 5/5 in all extremities. Sensation intact.  PSYCHIATRIC: The patient is alert and oriented x 3.  SKIN: No obvious rash, lesion, or ulcer.    LABORATORY PANEL:   CBC  Recent Labs Lab 01/27/15 0419   WBC 12.5*  HGB 9.6*  HCT 29.3*  PLT 197   ------------------------------------------------------------------------------------------------------------------  Chemistries   Recent Labs Lab 01/27/15 0419  NA 136  K 4.9  CL 108  CO2 24  GLUCOSE 117*  BUN 16  CREATININE 0.52  CALCIUM 7.3*   ------------------------------------------------------------------------------------------------------------------  Cardiac Enzymes No results for input(s): TROPONINI in the last 168 hours. ------------------------------------------------------------------------------------------------------------------  RADIOLOGY:  No results found.  EKG:   Orders placed or performed during the hospital encounter of 01/19/15  . ED EKG  . ED EKG  . EKG 12-Lead  . EKG 12-Lead    ASSESSMENT AND PLAN:   Principal Problem:   Lytic bone lesion of hip Active Problems:   Generalized weakness   UTI (urinary tract infection)   Protein-calorie malnutrition, severe   Bone metastasis   Lytic bone lesions on xray   Weakness of left side of body   Mass of right breast   Breast cancer   Folic acid deficiency   #1. Generalized weakness secondary to widely metastatic disease breast.  Continue to work with PT. Plan for dc to SNF when bed available, she will stay there while completing radiation. Has been working hard with PT here.  #2 metastatic breast cancer: oncology following.  Continue radiation due to multiple lytic lesions and multi-level cord compression and epidural tumor, to have treatment today.  Would like to continue treatment in [place] when able. ER/PR and HER2/neu pending. Decadron per radiation oncology.  #3 UTI: culture with multiple bact species.  Completed rocephin. Repeat UA with 0-5 WBC's.  #4 AOCD: continue folate   CODE STATUS: Full  TOTAL TIME TAKING CARE OF THIS PATIENT: 35 minutes.  Greater than 50% of time spent in care coordination and counseling.All the records are  reviewed and case discussed with Care Management/Social Workerr. Management plans discussed with the patient, family and they are in agreement.  POSSIBLE D/C IN 1-2 DAYS, DEPENDING ON SNF bed Para Skeans, Ginia Rudell M.D on 01/27/2015 at 1:00 PM  Between 7am to 6pm - Pager - [phone]  After 6pm go to [email or web address] - password [email or web address] [facility]  [place] [facility]  Office  [phone]  CC: Primary care physician; No primary care provider on file.

## 2015-01-27 NOTE — Plan of Care (Signed)
Problem: Discharge Progression Outcomes Goal: Other Discharge Outcomes/Goals Plan of care progress to goals: 1. Plans to go to ECF at discharge - in progress. 2. Pain medication given before radiation today which was effective.  3. Hemodynamically:               -VSS, afebrile 4. Tolerating diet well.

## 2015-01-28 ENCOUNTER — Ambulatory Visit
Admission: RE | Admit: 2015-01-28 | Discharge: 2015-01-28 | Disposition: A | Discharge status: Home / Self Care | Payer: BLUE CROSS/BLUE SHIELD | Source: Ambulatory Visit | Attending: Radiation Oncology | Admitting: Radiation Oncology

## 2015-01-28 DIAGNOSIS — Z510 Encounter for antineoplastic radiation therapy: Secondary | ICD-10-CM | POA: Diagnosis not present

## 2015-01-28 DIAGNOSIS — C7951 Secondary malignant neoplasm of bone: Secondary | ICD-10-CM | POA: Diagnosis present

## 2015-02-01 ENCOUNTER — Ambulatory Visit
Admission: RE | Admit: 2015-02-01 | Discharge: 2015-02-01 | Disposition: A | Discharge status: Home / Self Care | Payer: BLUE CROSS/BLUE SHIELD | Source: Ambulatory Visit | Attending: Radiation Oncology | Admitting: Radiation Oncology

## 2015-02-01 DIAGNOSIS — C7951 Secondary malignant neoplasm of bone: Secondary | ICD-10-CM | POA: Diagnosis not present

## 2015-02-02 ENCOUNTER — Ambulatory Visit
Admission: RE | Admit: 2015-02-02 | Discharge: 2015-02-02 | Disposition: A | Discharge status: Home / Self Care | Payer: BLUE CROSS/BLUE SHIELD | Source: Ambulatory Visit | Attending: Radiation Oncology | Admitting: Radiation Oncology

## 2015-02-02 DIAGNOSIS — C7951 Secondary malignant neoplasm of bone: Secondary | ICD-10-CM | POA: Diagnosis not present

## 2015-02-03 ENCOUNTER — Encounter: Payer: Self-pay | Admitting: Hematology and Oncology

## 2015-02-03 ENCOUNTER — Ambulatory Visit
Admission: RE | Admit: 2015-02-03 | Discharge: 2015-02-03 | Disposition: A | Discharge status: Home / Self Care | Payer: BLUE CROSS/BLUE SHIELD | Source: Ambulatory Visit | Attending: Radiation Oncology | Admitting: Radiation Oncology

## 2015-02-03 ENCOUNTER — Encounter: Payer: Self-pay | Admitting: *Deleted

## 2015-02-03 ENCOUNTER — Inpatient Hospital Stay: Payer: BLUE CROSS/BLUE SHIELD | Attending: Hematology and Oncology | Admitting: Hematology and Oncology

## 2015-02-03 DIAGNOSIS — D649 Anemia, unspecified: Secondary | ICD-10-CM

## 2015-02-03 DIAGNOSIS — R748 Abnormal levels of other serum enzymes: Secondary | ICD-10-CM | POA: Insufficient documentation

## 2015-02-03 DIAGNOSIS — B370 Candidal stomatitis: Secondary | ICD-10-CM

## 2015-02-03 DIAGNOSIS — R531 Weakness: Secondary | ICD-10-CM | POA: Insufficient documentation

## 2015-02-03 DIAGNOSIS — Z79899 Other long term (current) drug therapy: Secondary | ICD-10-CM | POA: Diagnosis not present

## 2015-02-03 DIAGNOSIS — Z85850 Personal history of malignant neoplasm of thyroid: Secondary | ICD-10-CM | POA: Insufficient documentation

## 2015-02-03 DIAGNOSIS — C7951 Secondary malignant neoplasm of bone: Secondary | ICD-10-CM | POA: Diagnosis not present

## 2015-02-03 DIAGNOSIS — C50911 Malignant neoplasm of unspecified site of right female breast: Secondary | ICD-10-CM | POA: Diagnosis not present

## 2015-02-03 DIAGNOSIS — R634 Abnormal weight loss: Secondary | ICD-10-CM | POA: Insufficient documentation

## 2015-02-03 DIAGNOSIS — M549 Dorsalgia, unspecified: Secondary | ICD-10-CM | POA: Diagnosis not present

## 2015-02-03 DIAGNOSIS — K219 Gastro-esophageal reflux disease without esophagitis: Secondary | ICD-10-CM | POA: Insufficient documentation

## 2015-02-03 MED ORDER — NYSTATIN 100000 UNIT/ML MT SUSP
5.0000 mL | Freq: Four times a day (QID) | OROMUCOSAL | Status: AC
Start: 2015-02-03 — End: ?

## 2015-02-03 NOTE — Progress Notes (Signed)
Pt was hosp. Progressive back pain and had bx of hip and here to get dx results.. Did not enter pharmacy because pt is just in [place] to visit friend and once she is completed rehab she will rtn back to [place]

## 2015-02-03 NOTE — Progress Notes (Signed)
Oncology Nurse Navigator Documentation  Oncology Nurse Navigator Flowsheets 02/03/2015  Referral date to RadOnc/MedOnc 02/03/2015  Navigator Encounter Type Initial MedOnc  Patient Visit Type Medonc  Barriers/Navigation Needs (No Data)  Time Spent with Patient 60   Met with patient today during her medical oncology consultation.  She was seen in the hospital initially by Dr. Ma Hillock and today by Dr. Mike Gip.  Patient is from [place] and would like to go back to receive her care at [facility].  Dr. Mike Gip to make that referral.  Gave patient breast cancer educational literature, "My Breast Cancer Treatment Handbook" by Josephine Igo, RN.  She is to call if she has any questions or needs.

## 2015-02-03 NOTE — Progress Notes (Signed)
[facility] day:  02/03/2015  Chief Complaint: Denise Kaufman is a 54 y.o. female with metastatic breast cancer who is seen for initial assessment after recent hospitalization.  HPI: The patient was admitted to [facility] from 01/19/2015 - 01/27/2015.  She presented to the emergency room with weakness and back pain and imaging studies revealing widely metastatic disease. She had lost 95 pounds in 2 years.  She had been aware of a breast mass for 1 month.  She had developed increasing difficulty walking.  Labs were notable for a normocytic anemia, markedly elevated alkaline phosphatase (1593), and a low protein, albumen, and calcium. 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  She underwent CT guided biopsy of the right iliac wing on 01/21/2015.  Pathology confirmed metastatic mammary carcinoma.  Tumor was ER > 90%, PR 51-90%, and Her2/neu 2+.  FISH is pending.  Symptomatically, she feels "ok".  She is continuing palliative radiation to her spine.  She has decided to return to [place] (her home).  She had been in [place] visiting a friend.  Past Medical History  Diagnosis Date  . Cancer   . Hypothyroidism complicating pregnancy in third trimester   . Thyroid cancer     Papillary thyroid cancer  . Thyroid cancer 1998  . GERD (gastroesophageal reflux disease)   . Folate deficiency anemia   . Cystitis   . Hypocalcemia     Past Surgical History  Procedure Laterality Date  . Thyroidectomy      Family History  Problem Relation Age of Onset  . Non-Hodgkin's lymphoma Father     Social History:  reports that she has never smoked. She does not have any smokeless tobacco history on file. She reports that she does not drink alcohol or use illicit drugs.  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  The patient is  accompanied by one of the transportation personnel from [employer].  Allergies: No Known Allergies  Current Medications: Current Outpatient Prescriptions  Medication Sig Dispense Refill  . acetaminophen (TYLENOL) 325 MG tablet Take 2 tablets (650 mg total) by mouth every 6 (six) hours as needed for mild pain (or Fever >/= 101). 30 tablet 0  . dexamethasone (DECADRON) 4 MG tablet Take 1 tablet (4 mg total) by mouth every 6 (six) hours. 60 tablet 0  . folic acid (FOLVITE) 1 MG tablet Take 1 tablet (1 mg total) by mouth daily. 30 tablet 0  . levothyroxine (SYNTHROID, LEVOTHROID) 112 MCG tablet Take 112 mcg by mouth daily before breakfast.    . magnesium hydroxide (MILK OF MAGNESIA) 400 MG/5ML suspension Take 30 mLs by mouth daily as needed for mild constipation. 360 mL 0  . oxyCODONE (OXY IR/ROXICODONE) 5 MG immediate release tablet Take 1 tablet (5 mg total) by mouth every 4 (four) hours as needed for moderate pain. 60 tablet 0  . pantoprazole (PROTONIX) 40 MG tablet Take 1 tablet (40 mg total) by mouth daily before breakfast. 30 tablet 0  . nystatin (MYCOSTATIN) 100000 UNIT/ML suspension Take 5 mLs (500,000 Units total) by mouth 4 (four) times daily. 300 mL 2   No current facility-administered medications for this visit.    Review of Systems:  GENERAL: Fatigue. No fevers or sweats. Weight loss of 95 pounds in the past 2 years. PERFORMANCE STATUS (ECOG): 2 HEENT: Two episodes of blurry  vision. No runny nose, sore throat, mouth sores or tenderness. Lungs: No shortness of breath or cough. No hemoptysis. Cardiac: No chest pain, palpitations, orthopnea, or PND. Breasts: Aware of right breast mass x 1 month. GI: Anorexia. One episode of emesis. No nausea, diarrhea, constipation, melena or hematochezia. GU: No incontinence. No urgency, frequency, dysuria, or hematuria. Menses off/on. Musculoskeletal: Low back pain. No joint pain. No muscle tenderness. Extremities: No pain  or swelling. Skin: No rashes or skin changes. Neuro: Left sided weakness (lower extremity strength most affected). No headache, numbness or balance or coordination issues. Endocrine: History of thyroid carcinoma s/p partial thyroidectomy. No diabetes, hot flashes or night sweats. Psych: No mood changes, depression or anxiety. Pain: Low back pain. Review of systems: All other systems reviewed and found to be negative.  Physical Exam: Last menstrual period 11/15/2014. GENERAL: Well developed, well nourished sitting in a wheelchair in clinic in no acute distress.  MENTAL STATUS: Alert and oriented to person, place and time. HEAD: Brown hair with graying. Normocephalic, atraumatic, face symmetric, no Cushingoid features. EYES: Blue eyes. Pupils equal round and reactive to light and accomodation. No conjunctivitis or scleral icterus. Extraocular movement intact. ENT: Thrush.  Tongue normal. Mucous membranes moist.  RESPIRATORY: Clear to auscultation without rales, wheezes or rhonchi. CARDIOVASCULAR: Regular rate and rhythm without murmur, rub or gallop. BREAST: Right breast with 2.5-3 cm mass at the 3-4 o'clock position (previously measured). Mass not fixed. No overlying skin changes. No nipple discharge. Left breast without masses, skin changes or nipple discharge. ABDOMEN: Soft, non-tender, with active bowel sounds, and no hepatosplenomegaly. No masses. BACK: No tenderness on percussion of the back. SKIN: Tattoos. No rashes, ulcers or lesions. EXTREMITIES: Extremities thin. No edema, no skin discoloration or tenderness. No palpable cords. LYMPH NODES: No palpable cervical, supraclavicular, axillary or inguinal adenopathy  NEUROLOGICAL: Appropriate. PSYCH: Appropriate  No visits with results within 3 Day(s) from this visit. Latest known visit with results is:  Admission on 01/19/2015, Discharged on 01/27/2015  No results displayed because visit has over 200  results.      Assessment:  [name] is a 54 y.o. female with a 2 year history of 95 pound weight loss, 10 month history of progressive weakness and pain, a 1 month history of right breast mass, and a 10 day history of acute decline in left sided strength.   Chest, abdomen, and pelvic CT on 01/19/2015 revealed a 2.6 cm right breast mass, multiple lytic lesions, and multi-level cord compression (worse at T9-T10) with several areas of epidural tumor. Head MRI on 01/21/2015 revealed multiple enhancing lesions in the calvarium and marked signal abnormality and enhancement of the inferior clivus and extending through the upper cervical spine with moderate to severe stenosis at C2-3.  CT guided biopsy of the right iliac wing on 01/21/2015 confirmed metastatic mammary carcinoma.  Tumor was ER > 90%, PR 51-90%, and Her2/neu 2+.  FISH is pending.  She is receiving palliative radiation to her spine.  She is on Decadron 4 mg po q 6 hours.  She has thrush.  She will be returning home to [place].  She is currently residing at [facility].  Plan: 1.  Discuss pathology and hormone receptor status.  If Her2/neu + by FISH, discuss Herceptin, Perjeta, and Taxotere. 2.  Discuss plan for Xgeva as an outpatient to prevent bone related events. 3.  Patient to complete radiation on 02/07/2015.   4.  Nystatin suspension 5 cc swish and spit 4-5 times a da7y.  5.  Patient to sign ROI for records to be sent back to [facility]. 6.  Patient to discuss with Dr. Baruch Gouty of radiation oncology plan for Decadron taper schedule.   7.  Contact Dr Joyce Gross, [phone] at the [facility]. 8.  RTC prn.   Lequita Asal, MD  02/03/2015

## 2015-02-04 ENCOUNTER — Ambulatory Visit: Payer: BLUE CROSS/BLUE SHIELD

## 2015-02-04 DIAGNOSIS — C7951 Secondary malignant neoplasm of bone: Secondary | ICD-10-CM | POA: Diagnosis not present

## 2015-02-06 ENCOUNTER — Encounter: Payer: Self-pay | Admitting: Hematology and Oncology

## 2015-02-07 ENCOUNTER — Ambulatory Visit: Payer: BLUE CROSS/BLUE SHIELD

## 2015-02-07 DIAGNOSIS — C7951 Secondary malignant neoplasm of bone: Secondary | ICD-10-CM | POA: Diagnosis not present

## 2015-02-22 ENCOUNTER — Telehealth: Payer: Self-pay | Admitting: *Deleted

## 2015-02-22 NOTE — Telephone Encounter (Signed)
Spoke with Dr. Reuel Boom nurse, [name], RN regarding Decadron that this patient has been taking since her hospitalization.  This medication was ordered by Talitha Givens, MD.  During the patient's admission. Dr. Lovie Macadamia will need to order the taper for that medication.

## 2015-02-22 NOTE — Telephone Encounter (Signed)
Per Dr Mike Gip, Dr Baruch Gouty was to discuss a taper of Decadron with the patient

## 2015-02-22 NOTE — Telephone Encounter (Signed)
Dr Lovie Macadamia wants [place] to wean her off the dexamethasone. I have placed a call to find out why

## 2015-03-01 ENCOUNTER — Ambulatory Visit: Admit: 2015-03-01 | Discharge: 2015-03-01 | Payer: PRIVATE HEALTH INSURANCE | Attending: Specialist

## 2015-03-01 DIAGNOSIS — C50911 Malignant neoplasm of unspecified site of right female breast: Secondary | ICD-10-CM

## 2015-03-01 MED ORDER — DOXYCYCLINE HYCLATE 100 MG CAP
100 mg | ORAL_CAPSULE | Freq: Two times a day (BID) | ORAL | 0 refills | Status: DC
Start: 2015-03-01 — End: 2015-03-01

## 2015-03-01 MED ORDER — LEVOTHYROXINE 112 MCG TAB
112 mcg | ORAL_TABLET | Freq: Every day | ORAL | 3 refills | Status: AC
Start: 2015-03-01 — End: ?

## 2015-03-01 MED ORDER — LIDOCAINE-PRILOCAINE 2.5 %-2.5 % TOPICAL CREAM
CUTANEOUS | 0 refills | Status: DC | PRN
Start: 2015-03-01 — End: 2015-03-01

## 2015-03-01 MED ORDER — LIOTHYRONINE 25 MCG TAB
25 mcg | ORAL_TABLET | Freq: Every day | ORAL | 3 refills | Status: AC
Start: 2015-03-01 — End: ?

## 2015-03-01 MED ORDER — LIDOCAINE-PRILOCAINE 2.5 %-2.5 % TOPICAL CREAM
CUTANEOUS | 0 refills | Status: DC | PRN
Start: 2015-03-01 — End: 2015-03-02

## 2015-03-01 MED ORDER — ONDANSETRON HCL 8 MG TAB
8 mg | ORAL_TABLET | Freq: Three times a day (TID) | ORAL | 3 refills | Status: DC | PRN
Start: 2015-03-01 — End: 2015-03-01

## 2015-03-01 MED ORDER — PROCHLORPERAZINE MALEATE 10 MG TAB
10 mg | ORAL_TABLET | Freq: Four times a day (QID) | ORAL | 5 refills | Status: DC | PRN
Start: 2015-03-01 — End: 2015-03-02

## 2015-03-01 MED ORDER — ONDANSETRON HCL 8 MG TAB
8 mg | ORAL_TABLET | Freq: Three times a day (TID) | ORAL | 3 refills | Status: DC | PRN
Start: 2015-03-01 — End: 2015-03-02

## 2015-03-01 MED ORDER — DOXYCYCLINE HYCLATE 100 MG CAP
100 mg | ORAL_CAPSULE | Freq: Two times a day (BID) | ORAL | 0 refills | Status: DC
Start: 2015-03-01 — End: 2015-03-02

## 2015-03-01 MED ORDER — PROCHLORPERAZINE MALEATE 10 MG TAB
10 mg | ORAL_TABLET | Freq: Four times a day (QID) | ORAL | 5 refills | Status: DC | PRN
Start: 2015-03-01 — End: 2015-03-01

## 2015-03-01 NOTE — Patient Instructions (Signed)
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   This is an expected side effect that your Doctor will be monitoring.  - If you experience fevers (temperature >100.4??F), bleeding or unexplained bruising, please call the office right away   Risk of Infection Your white blood cells can decrease temporarily due to chemotherapy and can put you at higher risk of infection.  Washing hands frequently with soap and avoiding sick contacts can reduce your risk of infection.  - If you experience fevers (temperature >100.4??F), shaking chills, or any signs of infection, please call the office immediately   Anemia Chemotherapy can cause your red blood cells to temporarily decrease; this is an expected side effect that your Doctor will be monitoring.  - You may experience fatigue if this occurs, please notify the office if you experience bleeding, shortness of breath with minimal exertion or at rest, rapid heartbeat, or feeling as though you may lose consciousness.   Hair Loss Chemotherapy can affect your hair follicles and cause you to lose hair.  This can occur on your scalp hair but also all over your body including eyebrows and eye lashes   Nausea  You have been prescribed nausea medication to take if needed.  Please follow the directions given to you by your Doctor.  - Please call the office if the medications you have been given are not relieving nausea.   Vomiting Make sure you are taking anti-nausea medication as prescribed.  Eating small amounts of bland foods frequently can help.   - Please call the office right away if you are vomiting more than 4 times per day or are unable to keep down food or fluids   Diarrhea Eating small amounts of bland foods frequently can help, increase your fluid intake.  It is usually ok to take Imodium for diarrhea.  - Please call the office right away if you experience more than 4 episodes of watery diarrhea or if you are feeling dehydrated.     Female patients of childbearing age need to avoid pregnancy during chemotherapy.     You can reach Medical Oncology at [facility] with further questions or concerns at: [phone].  - Calls during normal business hours will reach our office.  - Calls after hours or on the weekend will reach an answering service and the on-call Oncologist will return your call.

## 2015-03-01 NOTE — Progress Notes (Signed)
Kristin Stanley is a 54 y.o. female new patient here for evaluation of breast cancer.

## 2015-03-01 NOTE — Progress Notes (Addendum)
[facility]  [address] [place]   [address]  W: [phone]   F: [facility]      Reason for visit: evaluation for treatment for   Breast Cancer    Consulting physician:  Dr. Mike Gip in [place]    HPI:   Kristin Stanley is a 53 y.o.  female who I was asked to see in consultation at the request of Dr. Mike Gip for evaluation for systemic therapy for metastatic breast cancer.    She has a history of papillary thyroid cancer s/p partial thyroidectomy and 2 rounds or RIA, last in 1999.    She was found in July 2016 to have multiple bone lytic lesions throughout her axial skeleton with multiple areas of probably cord impingement.  She was found to have a right breast mass, admitted from 01/19/15-01/27/15. She has lost 95 lbs in 2 years.  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      CT guided biopsy of the right iliac wing on 01/21/15 showing metastatic breast cancer, ER + at > 90%, PR + at 51-90%, Her 2 POSITIVE (IHC 2+, FISH  pending). Thyroglobulin is negative.      She received 10 fx to her C-L spine from 01/21/15-02/07/15    MRI brain negative for brain lesions on 01/21/15    She can walk with a walker.  Is taking dex 4 mg bid  Is taking oxycodone 5 mg daily for back pain, her pain is 5-7/10 with oxycodone, goes down to 2-3/10.    DX   Encounter Diagnosis   Name Primary?   ??? Cancer of right breast, stage 4 ([place]) Yes              Past Medical History   Diagnosis Date   ??? Cancer ([place])      thyroid   ??? GERD (gastroesophageal reflux disease)      Past Surgical History   Procedure Laterality Date   ??? Hx other surgical  1998     thyroid removed due to cancer     Social History     Social History   ??? Marital status: SINGLE     Spouse name: N/A   ??? Number of children: N/A   ??? Years of education: N/A     Social History Main Topics    ??? Smoking status: Never Smoker   ??? Smokeless tobacco: None   ??? Alcohol use No      Comment: Very rare   ??? Drug use: No   ??? Sexual activity: No     Other Topics Concern   ??? None     Social History Narrative   ??? None     Family History   Problem Relation Age of Onset   ??? Cancer Father      lymphoma   ??? Hypertension Father        Current Outpatient Prescriptions   Medication Sig Dispense Refill   ??? gabapentin (NEURONTIN) 100 mg capsule Take 100 mg by mouth three (3) times daily.     ??? dexamethasone (DECADRON) 4 mg tablet Take 4 mg by mouth four (4) times daily.     ??? folic acid (FOLVITE) 1 mg tablet Take 1 mg by mouth daily.     ??? magnesium hydroxide (MILK OF MAGNESIA)  400 mg/5 mL suspension Take 30 mL by mouth daily.     ??? nystatin (MYCOSTATIN) 100,000 unit/mL suspension Take 500,000 Units by mouth five (5) times daily.     ??? oxyCODONE IR (ROXICODONE) 5 mg immediate release tablet Take 5 mg by mouth every four (4) hours as needed.     ??? pantoprazole (PROTONIX) 40 mg tablet Take 40 mg by mouth daily.     ??? CARAFATE 100 mg/mL suspension Take 1 g by mouth three (3) times daily. Swish and swallow     ??? cholecalciferol (VITAMIN D3) 1,000 unit cap Take 3,000 Units by mouth daily.     ??? CALCIUM CARBONATE/VITAMIN D3 (CALCIUM 600 WITH VITAMIN D3 PO) Take 1 Tab by mouth daily.     ??? ALPRAZolam (XANAX) 0.25 mg tablet Take 0.25 mg by mouth three (3) times daily as needed for Anxiety.     ??? prochlorperazine (COMPAZINE) 10 mg tablet Take 1 Tab by mouth every six (6) hours as needed for Nausea. 50 Tab 5   ??? ondansetron hcl (ZOFRAN) 8 mg tablet Take 1 Tab by mouth every eight (8) hours as needed for Nausea. 24 Tab 3   ??? lidocaine-prilocaine (EMLA) topical cream Apply  to affected area as needed for Pain. 30 g 0   ??? doxycycline (VIBRAMYCIN) 100 mg capsule Take 1 Cap by mouth two (2) times a day for 42 days. 84 Cap 0   ??? liothyronine (CYTOMEL) 25 mcg tablet Take 1 Tab by mouth daily. 90 Tab 3    ??? levothyroxine (SYNTHROID) 112 mcg tablet Take 1 Tab by mouth daily. 90 Tab 3       No Known Allergies    Review of Systems    A comprehensive review of systems was performed and all systems were negative except for HPI.        Objective:  Physical Exam:  Visit Vitals   ??? BP (!) 133/100   ??? Pulse (!) 126   ??? Temp 97.8 ??F (36.6 ??C) (Temporal)   ??? Resp 18   ??? Ht 5\' 2"  (1.575 m)   ??? Wt 110 lb (49.9 kg)   ??? SpO2 95%   ??? BMI 20.12 kg/m2       General:  Alert, cooperative, no distress, appears stated age. In a wheelchair   Head:  Normocephalic, without obvious abnormality, atraumatic.   Eyes:  Conjunctivae/corneas clear. PERRL, EOMs intact.   Throat: Lips, mucosa, and tongue normal.    Neck: Supple, symmetrical, trachea midline, no adenopathy, thyroid: no enlargement/tenderness/nodules   Back:   Symmetric, no curvature. ROM normal. No CVA tenderness.   Lungs:   Clear to auscultation bilaterally.   Chest wall:  No tenderness or deformity.   Heart:  Regular rate and rhythm, S1, S2 normal, no murmur, click, rub or gallop.   Breast Exam:  Right breast mass at 1:00, 3 cm from the nipple, 3.5 cm mass   Abdomen:   Soft, non-tender. Bowel sounds normal. No masses,  No organomegaly.   Extremities: Extremities normal, atraumatic, no cyanosis or edema.   Skin: Skin color, texture, turgor normal. No rashes or lesions.   Lymph nodes: Cervical, supraclavicular, and axillary nodes normal.   Neurologic: CNII-XII intact.; 5/5 LE strength       Diagnostic Imaging   No results found for this or any previous visit.    No results found for this or any previous visit.    Lab Results  No results found for: WBC, HGB,  HGBP, HCT, PHCT, RBCH, PLT, MCV, HGBEXT, HCTEXT, PLTEXT, HGBEXT, HCTEXT, PLTEXT    No results found for: NA, K, CL, CO2, AGAP, GLU, BUN, CREA, BUCR, GFRAA, GFRNA, CA, TBIL, GPT, SGOT, AP, TP, ALB, GLOB, AGRAT, ALT    .    Assessment/Plan:  54 y.o. female with right breast cancer, ER +, PR +, HER 2 PENDING.  PS 0     1. 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    We discussed that the avg life expectancy with metastatic breast cancer is 2 years.    If her disease is HER 2 +, I recommend treating her with paclitaxel 80 mg/m2 d 1,8, trastuzumab 8 mg/kg load then 6 mg/kg d1, pertuzumab 840 mg load then 420 mg d1, of 21 day cycle.    We discussed the risks and benefits of chemotherapy. Potential side effects include, but are not limited to, nausea, vomiting, constipation, diarrhea, taste changes, myelosuppression, increased risk for infection, myalgias, fatigue, alopecia, mucositis, neuropathy, fluid retention, renal failure, cardiac damage, allergic reactions, infertility, and rarely, death.     If her 2 negative, recommend letrozole and palbociclib.  Will obtain this result and will call her.    If she is HER 2 positive, will need a TTE and port -- ordered.    Prescriptions for emla cream, compazine, zofran, and doxycycline    Will plan for her tentatively to start next week.    2. Emotional well being:  She has excellent support and is coping well with her diseasae    3. S/p cord compression:  Will have her Take the dexamethasone 4 mg twice a day for a week then once a day    4. Back pain:  Currently taking oxycodone 5 mg daily; will monitor    5. History of thyroid cancer:  Refilled her liothyronine and levothyroxine today      Thank you for this consult.  All of the patient's questions were answered today.    > 80 min were spent with this patient with > 50% of that time spent in face to face counseling.          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   This is an expected side effect that your Doctor will be monitoring.  - If you experience fevers (temperature >100.4??F), bleeding or unexplained bruising, please call the office right away   Risk of Infection Your white blood cells can decrease temporarily due to chemotherapy and can put you at higher risk of infection.  Washing hands frequently with soap and avoiding sick contacts can reduce your risk of infection.  - If you experience fevers (temperature >100.4??F), shaking chills, or any signs of infection, please call the office immediately   Anemia Chemotherapy can cause your red blood cells to temporarily decrease; this is an expected side effect that your Doctor will be  monitoring.  - You may experience fatigue if this occurs, please notify the office if you experience bleeding, shortness of breath with minimal exertion or at rest, rapid heartbeat, or feeling as though you may lose consciousness.   Hair Loss Chemotherapy can affect your hair follicles and cause you to lose hair.  This can occur on your scalp hair but also all over your body including eyebrows and eye lashes   Nausea  You have been prescribed nausea medication to take if needed.  Please follow the directions given to you by your Doctor.  - Please call the office if the medications you have been given are not relieving nausea.   Vomiting Make sure you are taking anti-nausea medication as prescribed.  Eating small amounts of bland foods frequently can help.  - Please call the office right away if you are vomiting more than 4 times per day or are unable to keep down food or fluids   Diarrhea Eating small amounts of bland foods frequently can help, increase your fluid intake.  It is usually ok to take Imodium for diarrhea.   - Please call the office right away if you experience more than 4 episodes of watery diarrhea or if you are feeling dehydrated.     Female patients of childbearing age need to avoid pregnancy during chemotherapy.     You can reach Medical Oncology at [facility] with further questions or concerns at: [phone].  - Calls during normal business hours will reach our office.  - Calls after hours or on the weekend will reach an answering service and the on-call Oncologist will return your call.         Follow-up Disposition:  Return in 8 days (on 03/09/2015).    Kristin Gross, MD       HER 2 FISH negative (ratio 1.34; sig/cell 3.4).  Discussed this with patient, canceled port and TTE.      The risks and benefits of aromatase inhibitors (anastrozole, letrozole, and exemestane) were discussed in detail and the patient was informed of the following: Risks include the development of painful muscles and joints (arthralgia/myalgia) and bone loss. Muscle and joint pain can be severe but rarely result in any tissue damage; symptoms usually resolve in several weeks when the medication is stopped. Bone loss is common and a bone density test is recommended as a baseline and then yearly to every several years depending on initial results. The risk of fractures is increased by a few percent in patients taking these drugs, but careful monitoring of bone density and using bone protecting agents when indicated can minimize these risks. Unlike tamoxifen there is no increased risk of blood clots or endometrial cancer. AIs can cause or worsen vaginal dryness but women using these drugs should not use vaginal estrogen preparations for these symptoms. AIs can also cause or increase hot flashes. Any other symptoms should be reported.    She is agreeable to letrozole 2.5 mg daily.  rx in.  Also discussed palbociclib 125 mg daily for days 1-21 of 28 day cycle.    Discussed side effect including but not limited to myelosuppression,  fatigue, long QTc, mouth sores, nausea, diarrhea.  She is agreeable to this, rx in.

## 2015-03-01 NOTE — Progress Notes (Signed)
Social Work Insurance claims handler   Patient Name: Kristin Stanley      Medical History:   Met breast cancer    Advance Care Planning:   There is no AMD on file; will address at future visit.     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 She tells me she is on FMLA and short-term disability leave. We discussed some of her options to include long-term disability and/or SS disability. Pt would like to discuss again once she starts treatment and has an understanding of that.     Narrative/Barriers to Care:   Met with pt to introduce social worker role and support available to pt. Pt shared that she lives alone (in a home on a friend's property) and has adequate DME (walker, toilet seat riser, wheelchair and shower seat). Pt is followed by home health (PT, OT, ST, and RN). She does not know the name of the home health company but will get that to [place] at a later date if needed.     Pt is single and has no children. She tells me she has an excellent support system in place (numerous friends are providing her the emotional and practical support she needs).     Provided pt with my contact information and we plan to connect again to discuss any barriers if they arise.     Plan:   1. SSDI info  2. Ongoing support    Thank you.  -Renee Ramus

## 2015-03-02 MED ORDER — PALBOCICLIB 125 MG CAPSULE
125 mg | ORAL_CAPSULE | Freq: Every day | ORAL | 6 refills | Status: DC
Start: 2015-03-02 — End: 2015-03-03

## 2015-03-02 MED ORDER — PALBOCICLIB 125 MG CAPSULE
125 mg | ORAL_CAPSULE | Freq: Every day | ORAL | 6 refills | Status: DC | PRN
Start: 2015-03-02 — End: 2015-03-02

## 2015-03-02 MED ORDER — LETROZOLE 2.5 MG TAB
2.5 mg | ORAL_TABLET | Freq: Every day | ORAL | 3 refills | Status: AC
Start: 2015-03-02 — End: ?

## 2015-03-02 NOTE — Addendum Note (Signed)
Addended byVernie Shanks J on: 03/02/2015 10:25 AM      Modules accepted: Orders

## 2015-03-02 NOTE — Addendum Note (Signed)
Addended by: Joyce Gross on: 03/02/2015 02:47 PM      Modules accepted: Orders

## 2015-03-02 NOTE — Telephone Encounter (Signed)
Per verbal order from Dr. Laurena Bering, orders received for the following:  Requested Prescriptions     Signed Prescriptions Disp Refills   ??? palbociclib 125 mg cap 21 Cap 6     Sig: Take 125 mg by mouth daily. Take daily for 21 days then 7 days off     Authorizing Provider: Joyce Gross     Ordering User: Reynaldo Minium, Mariajose Mow J

## 2015-03-03 ENCOUNTER — Telehealth: Payer: Self-pay

## 2015-03-03 LAB — SURGICAL PATHOLOGY

## 2015-03-03 MED ORDER — PALBOCICLIB 125 MG CAPSULE
125 mg | ORAL_CAPSULE | Freq: Every day | ORAL | 6 refills | Status: AC
Start: 2015-03-03 — End: ?

## 2015-03-03 NOTE — Telephone Encounter (Signed)
Called pt's friend [name] Epting) back on 03/02/2015 regarding message she left on pt's FMLA paperwork.  Pt's friend had dropped off paperwork and was wanting to know when it could be completed.  Upon speaking with Dr. Mike Gip I was informed she had only diagnosed pt and saw her minimal before she moved to [place] and will be receiving treatment and making plans for treatment there.  I informed pt's friend without giving out any information that we could not fill out her friends paperwork because we do not know the path or treatment she plans to receive in the future in [place] and that would make it difficult to know leave information needed for documentation.  Kim verbalized an understanding and stated they were just not sure where to begin being new to this.

## 2015-03-03 NOTE — Progress Notes (Signed)
Patient has moved back to home in [place] where she will continue care. No further distress follow up needed at this time.

## 2015-03-03 NOTE — Telephone Encounter (Signed)
Per patient's insurance, prescription needs to be filled at [facility].  New prescription sent to [facility] as requested.

## 2015-03-03 NOTE — Telephone Encounter (Signed)
Patient called and stated that her letrozole has shipped. She stated that there was another medication that she was supposed to be getting and she was unaware if it was Ibrance, or something by another name. She stated that she is not sure whether that is shipping. She would like a call back at [phone]. Thank you.

## 2015-03-03 NOTE — Telephone Encounter (Signed)
Called and spoke to patient and advised her that I had spoken to [facility] and that they had confirmed that they had received the prescription for Ibrance and that it was in process and was still pending insurance approval.  Advised patient that once approval was received then [facility] would contact her to set up delivery of medication.  Advised patient to call office back tomorrow if she didn't hear back from [facility].  Patient voices understanding and denies any further questions at this time.

## 2015-03-04 ENCOUNTER — Encounter: Payer: Self-pay | Admitting: Diagnostic Radiology

## 2015-03-04 MED ORDER — ZOLEDRONIC ACID 4 MG/100 ML IN MANNITOL & WATER IV
4 mg/100 mL | Freq: Once | INTRAVENOUS | Status: AC
Start: 2015-03-04 — End: 2015-03-09

## 2015-03-04 NOTE — Telephone Encounter (Signed)
Patient left voicemail for Kristin Stanley stating that emailing her the co-pay card would be fine. Her email address is [email or web address]. She also stated that she needs to update her address in the system. The address she provided is one she does not actually live at. Her address is [address] [place] [address]. I have updated this in the system.

## 2015-03-04 NOTE — Telephone Encounter (Signed)
Called patient and left a message on her voicemail to call back Dr. Valinda Hoar office at her earliest convenience.  Called patient in regards to [employer] co-pay card.

## 2015-03-04 NOTE — Telephone Encounter (Signed)
[employer] card sent to the e-mail the patient provided and informed the patient to call with any questions or concerns.

## 2015-03-07 ENCOUNTER — Encounter

## 2015-03-07 NOTE — Telephone Encounter (Signed)
Called and advised patient that insurance had approved her prior authorization for Ibrance.  Advised patient that I had spoken to [facility] and notified them that insurance approval had been obtained for Ibrance.  Advised patient that [facility] stated that they would move prescription through the process and would be in touch with patient to set up delivery time.  Patient voices understanding and denies any further questions at this time.

## 2015-03-08 NOTE — Telephone Encounter (Signed)
Patient left voicemail stating that she was told to call if she had not heard anything about her prescription that was called in. She looked on her [facility] account and the prescription is being processed but she has not yet heard anything about delivery. If needed, patient's CB# is [phone]. Thanks.

## 2015-03-08 NOTE — Telephone Encounter (Signed)
Called and spoke to [employer], who states that Kristin Stanley is scheduled to be delivered to patient tomorrow.  Called and spoke to patient and confirmed that Kristin Stanley is set up to be delivered tomorrow.  Patient denies any further questions at this time.

## 2015-03-09 ENCOUNTER — Inpatient Hospital Stay: Admit: 2015-03-09 | Payer: BLUE CROSS/BLUE SHIELD

## 2015-03-09 ENCOUNTER — Encounter: Attending: Nurse Practitioner

## 2015-03-09 ENCOUNTER — Ambulatory Visit: Admit: 2015-03-09 | Discharge: 2015-03-09 | Payer: PRIVATE HEALTH INSURANCE | Attending: Nurse Practitioner

## 2015-03-09 ENCOUNTER — Ambulatory Visit: Payer: BLUE CROSS/BLUE SHIELD | Admitting: Radiation Oncology

## 2015-03-09 DIAGNOSIS — C50911 Malignant neoplasm of unspecified site of right female breast: Secondary | ICD-10-CM

## 2015-03-09 DIAGNOSIS — Z01818 Encounter for other preprocedural examination: Secondary | ICD-10-CM

## 2015-03-09 LAB — CBC WITH AUTOMATED DIFF
ABS. BASOPHILS: 0 10*3/uL (ref 0.0–0.1)
ABS. EOSINOPHILS: 0 10*3/uL (ref 0.0–0.4)
ABS. LYMPHOCYTES: 0.6 10*3/uL — ABNORMAL LOW (ref 0.8–3.5)
ABS. MONOCYTES: 0.6 10*3/uL (ref 0.0–1.0)
ABS. NEUTROPHILS: 8.2 10*3/uL — ABNORMAL HIGH (ref 1.8–8.0)
BASOPHILS: 0 % (ref 0–1)
EOSINOPHILS: 0 % (ref 0–7)
HCT: 39.2 % (ref 35.0–47.0)
HGB: 12.8 g/dL (ref 11.5–16.0)
LYMPHOCYTES: 6 % — ABNORMAL LOW (ref 12–49)
MCH: 31.9 PG (ref 26.0–34.0)
MCHC: 32.7 g/dL (ref 30.0–36.5)
MCV: 97.8 FL (ref 80.0–99.0)
MONOCYTES: 6 % (ref 5–13)
NEUTROPHILS: 88 % — ABNORMAL HIGH (ref 32–75)
PLATELET: 318 10*3/uL (ref 150–400)
RBC: 4.01 M/uL (ref 3.80–5.20)
RDW: 15.2 % — ABNORMAL HIGH (ref 11.5–14.5)
WBC: 9.4 10*3/uL (ref 3.6–11.0)

## 2015-03-09 LAB — EKG, 12 LEAD, INITIAL
Atrial Rate: 112 {beats}/min
Calculated P Axis: 17 degrees
Calculated R Axis: 23 degrees
Calculated T Axis: -46 degrees
P-R Interval: 118 ms
Q-T Interval: 276 ms
QRS Duration: 84 ms
QTC Calculation (Bezet): 376 ms
Ventricular Rate: 112 {beats}/min

## 2015-03-09 LAB — POC CHEM8
Anion gap (POC): 14 mmol/L (ref 5–15)
BUN (POC): 15 MG/DL (ref 9–20)
CO2 (POC): 24 MMOL/L (ref 21–32)
Calcium, ionized (POC): 1.06 MMOL/L — ABNORMAL LOW (ref 1.12–1.32)
Chloride (POC): 104 MMOL/L (ref 98–107)
Creatinine (POC): 0.3 MG/DL — ABNORMAL LOW (ref 0.6–1.3)
GFRAA, POC: >60 mL/min/{1.73_m2} (ref >60)
GFRNA, POC: >60 mL/min/{1.73_m2} (ref >60)
Glucose (POC): 91 MG/DL (ref 65–105)
Hematocrit (POC): 38 % (ref 35.0–47.0)
Hemoglobin (POC): 12.9 GM/DL (ref 11.5–16.0)
Potassium (POC): 4.1 MMOL/L (ref 3.5–5.1)
Sodium (POC): 136 MMOL/L (ref 136–145)

## 2015-03-09 LAB — HEPATIC FUNCTION PANEL
A-G Ratio: 0.7 — ABNORMAL LOW (ref 1.1–2.2)
ALT (SGPT): 35 U/L (ref 12–78)
AST (SGOT): 25 U/L (ref 15–37)
Albumin: 2.6 g/dL — ABNORMAL LOW (ref 3.5–5.0)
Alk. phosphatase: 424 U/L — ABNORMAL HIGH (ref 45–117)
Bilirubin, direct: <0.1 MG/DL (ref 0.0–0.2)
Bilirubin, total: 0.2 MG/DL (ref 0.2–1.0)
Globulin: 3.6 g/dL (ref 2.0–4.0)
Protein, total: 6.2 g/dL — ABNORMAL LOW (ref 6.4–8.2)

## 2015-03-09 MED ORDER — OXYCODONE 5 MG TAB
5 mg | ORAL_TABLET | ORAL | 0 refills | Status: DC | PRN
Start: 2015-03-09 — End: 2015-03-23

## 2015-03-09 MED ORDER — SODIUM CHLORIDE 0.9 % IJ SYRG
INTRAMUSCULAR | Status: AC | PRN
Start: 2015-03-09 — End: 2015-03-10

## 2015-03-09 NOTE — Progress Notes (Signed)
Pt seen in office for follow up. Results reviewed at that time with provider

## 2015-03-09 NOTE — Progress Notes (Signed)
Kristin Stanley is a 54 y.o. female here for follow-up of breast cancer.

## 2015-03-09 NOTE — Progress Notes (Addendum)
[facility]  [address], [place]   [address]  W: [phone]   F: [phone]      f/u HEME/ONC CONSULT      Reason for visit: evaluation for treatment for   Breast Cancer    Consulting physician:  Dr. Mike Gip in [place]    HPI:   Kristin Stanley is a 54 y.o.  female who I was asked to see in consultation at the request of Dr. Mike Gip for evaluation for systemic therapy for metastatic breast cancer.    She has a history of papillary thyroid cancer s/p partial thyroidectomy and 2 rounds or RIA, last in 1999.    She was found in July 2016 to have multiple bone lytic lesions throughout her axial skeleton with multiple areas of probably cord impingement.  She was found to have a right breast mass, admitted from 01/19/15-01/27/15. She has lost 95 lbs in 2 years.  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      CT guided biopsy of the right iliac wing on 01/21/15 showing metastatic breast cancer, ER + at > 90%, PR + at 51-90%, Her 2 Negative (IHC 2+, FISH  Negative, ratio 1.34; sig/cell 3.4 ). Thyroglobulin is negative.      She received 10 fx to her C-L spine from 01/21/15-02/07/15    MRI brain negative for brain lesions on 01/21/15    Palbociclib and Letrozole: 03/10/15  Zometa q3 months: 03/09/15    Interval History: In today for follow up and zometa. Today complains gr 1 fatigue, gr 1 insomnia, gr 2 pain, She can walk with a walker.  Is taking dex 4 mg bid  Is taking oxycodone 5 mg daily for back pain, her pain is 5-7/10 with oxycodone, goes down to 2-3/10.    DX   Encounter Diagnosis   Name Primary?   ??? Cancer of right breast, stage 4 ([place]) Yes        Past Medical History   Diagnosis Date   ??? Cancer ([place])      thyroid   ??? GERD (gastroesophageal reflux disease)      Past Surgical History   Procedure Laterality Date   ??? Hx other surgical  1998      thyroid removed due to cancer     Social History     Social History   ??? Marital status: SINGLE     Spouse name: N/A   ??? Number of children: N/A   ??? Years of education: N/A     Social History Main Topics   ??? Smoking status: Never Smoker   ??? Smokeless tobacco: None   ??? Alcohol use No      Comment: Very rare   ??? Drug use: No   ??? Sexual activity: No     Other Topics Concern   ??? None     Social History Narrative     Family History   Problem Relation Age of Onset   ??? Cancer Father      lymphoma   ??? Hypertension Father        Current Outpatient Prescriptions   Medication Sig Dispense Refill   ??? gabapentin (NEURONTIN) 100 mg capsule Take 100 mg by mouth three (3) times daily.     ??? dexamethasone (DECADRON) 4 mg tablet Take 4 mg by mouth two (2) times daily (  with meals).     ??? folic acid (FOLVITE) 1 mg tablet Take 1 mg by mouth daily.     ??? pantoprazole (PROTONIX) 40 mg tablet Take 40 mg by mouth daily.     ??? cholecalciferol (VITAMIN D3) 1,000 unit cap Take 3,000 Units by mouth daily.     ??? CALCIUM CARBONATE/VITAMIN D3 (CALCIUM 600 WITH VITAMIN D3 PO) Take 1 Tab by mouth daily.     ??? liothyronine (CYTOMEL) 25 mcg tablet Take 1 Tab by mouth daily. 90 Tab 3   ??? levothyroxine (SYNTHROID) 112 mcg tablet Take 1 Tab by mouth daily. 90 Tab 3   ??? palbociclib 125 mg cap Take 125 mg by mouth daily. Take daily for 21 days then 7 days off 21 Cap 6   ??? letrozole (FEMARA) 2.5 mg tablet Take 1 Tab by mouth daily. 90 Tab 3   ??? nystatin (MYCOSTATIN) 100,000 unit/mL suspension Take 500,000 Units by mouth five (5) times daily.     ??? oxyCODONE IR (ROXICODONE) 5 mg immediate release tablet Take 5 mg by mouth every four (4) hours as needed.     ??? ALPRAZolam (XANAX) 0.25 mg tablet Take 0.25 mg by mouth three (3) times daily as needed for Anxiety.       Facility-Administered Medications Ordered in Other Visits   Medication Dose Route Frequency Provider Last Rate Last Dose    ??? sodium chloride (NS) flush 10 mL  10 mL IntraVENous PRN Joyce Gross, MD       ??? zoledronic acid (ZOMETA) 4 mg/100 mL infusion  4 mg IntraVENous ONCE Mallory N Sandridge, NP           No Known Allergies    Review of Systems    A comprehensive review of systems was performed and all systems were negative except for HPI and for the symptom review form, reviewed and scanned in.    Objective:  Physical Exam:  Visit Vitals   ??? BP (!) 148/110   ??? Pulse (!) 112   ??? Temp 97.4 ??F (36.3 ??C) (Temporal)   ??? Resp 18   ??? Ht 5\' 2"  (1.575 m)   ??? Wt 110 lb (49.9 kg)   ??? SpO2 96%   ??? BMI 20.12 kg/m2       General:  Alert, cooperative, no distress, appears stated age. In a wheelchair   Head:  Normocephalic, without obvious abnormality, atraumatic.   Eyes:  Conjunctivae/corneas clear. PERRL, EOMs intact.   Throat: Lips, mucosa, and tongue normal.    Neck: Supple, symmetrical, trachea midline, no adenopathy, thyroid: no enlargement/tenderness/nodules   Back:   Symmetric, no curvature. ROM normal. No CVA tenderness.   Lungs:   Clear to auscultation bilaterally.   Chest wall:  No tenderness or deformity.   Heart:  Regular rate and rhythm, S1, S2 normal, no murmur, click, rub or gallop.   Breast Exam:  Right breast mass at 1:00, 3 cm from the nipple, 3.5 cm mass (last exam, deferred today)   Abdomen:   Soft, non-tender. Bowel sounds normal. No masses,  No organomegaly.   Extremities: Extremities normal, atraumatic, no cyanosis or edema.   Skin: Skin color, texture, turgor normal. No rashes or lesions.       Neurologic: CNII-XII intact.; 5/5 LE strength       Diagnostic Imaging   No results found for this or any previous visit.    No results found for this or any previous visit.    Lab  Results  No results found for: WBC, HGB, HGBP, HCT, PHCT, RBCH, PLT, MCV, HGBEXT, HCTEXT, PLTEXT, HGBEXT, HCTEXT, PLTEXT    No results found for: NA, K, CL, CO2, AGAP, GLU, BUN, CREA, BUCR, GFRAA,  GFRNA, CA, TBIL, GPT, SGOT, AP, TP, ALB, GLOB, AGRAT, ALT    .    Assessment/Plan:  54 y.o. female with right breast cancer, ER +, PR +, HER 2 negative.  PS 0    1. 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    We discussed that the avg life expectancy with metastatic breast cancer is 2 years.    Her 2 negative.     The risks and benefits of aromatase inhibitors (anastrozole, letrozole, and exemestane) were discussed in detail and the patient was informed of the following: Risks include the development of painful muscles and joints (arthralgia/myalgia) and bone loss. Muscle and joint pain can be severe but rarely result in any tissue damage; symptoms usually resolve in several weeks when the medication is stopped. Bone loss is common and a bone density test is recommended as a baseline and then yearly to every several years depending on initial results. The risk of fractures is increased by a few percent in patients taking these drugs, but careful monitoring of bone density and using bone protecting agents when indicated can minimize these risks. Unlike tamoxifen there is no increased risk of blood clots or endometrial cancer. AIs can cause or worsen vaginal dryness but women using these drugs should not use vaginal estrogen preparations for these symptoms. AIs can also cause or increase hot flashes. Any other symptoms should be reported.  ??  She is agreeable to letrozole 2.5 mg daily. rx in. Also discussed palbociclib 125 mg daily for days 1-21 of 28 day cycle.  ??  Discussed side effect including but not limited to myelosuppression, fatigue, long QTc, mouth sores, nausea, diarrhea. She is agreeable to this, rx in.  Will get EKG.  Advised to not take take any grapefruit and grapefruit juice.    Will start these medications tomorrow     Will start zometa today.  We discussed side effects such as ONJ and the need to avoid invasive dental procedures while on these medications, renal damage, and hypocalcemia.    Will get CBC and CMP monthly. Will get these in 15 days.  Pill diary given today.    Prescriptions for emla cream, compazine, zofran, and doxycycline    2. Emotional well being:  She has excellent support and is coping well with her diseasae    3. S/p cord compression:  Will have her take the dexamethasone 4 mg daily now    4. 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 History of thyroid cancer:  Refilled her liothyronine and levothyroxine today    6. Elevated bp:  Will monitor      Thank you for this consult.  All of the patient's questions were answered today.    > 25 min were spent with this patient with > 50% of that time spent in face to face counseling.          There are no Patient Instructions on file for this visit.  Follow-up Disposition:  Return in about 2 weeks (around 03/23/2015).    Sonda Rumble, MD    Low calcium today, will hold zometa and retry next month

## 2015-03-09 NOTE — Progress Notes (Signed)
Let her know this is good, thanks

## 2015-03-09 NOTE — Progress Notes (Signed)
[facility] VISIT NOTE    1200  Pt arrived at [facility] via wheelchair and in no distress for 1st Zometa.  Assessment completed, pt c/o lower back pain as well as fatigue.    Blood pressure (!) 147/102, pulse (!) 101, temperature 96.8 ??F (36 ??C), resp. rate 18, not currently breastfeeding.    24g PIV placed to left AC without difficulty. Positive blood return noted.   Recent Results (from the past 12 hour(s))   CBC WITH AUTOMATED DIFF    Collection Time: 03/09/15 12:29 PM   Result Value Ref Range    WBC 9.4 3.6 - 11.0 K/uL    RBC 4.01 3.80 - 5.20 M/uL    HGB 12.8 11.5 - 16.0 g/dL    HCT 39.2 35.0 - 47.0 %    MCV 97.8 80.0 - 99.0 FL    MCH 31.9 26.0 - 34.0 PG    MCHC 32.7 30.0 - 36.5 g/dL    RDW 15.2 (H) 11.5 - 14.5 %    PLATELET 318 150 - 400 K/uL    NEUTROPHILS 88 (H) 32 - 75 %    LYMPHOCYTES 6 (L) 12 - 49 %    MONOCYTES 6 5 - 13 %    EOSINOPHILS 0 0 - 7 %    BASOPHILS 0 0 - 1 %    ABS. NEUTROPHILS 8.2 (H) 1.8 - 8.0 K/UL    ABS. LYMPHOCYTES 0.6 (L) 0.8 - 3.5 K/UL    ABS. MONOCYTES 0.6 0.0 - 1.0 K/UL    ABS. EOSINOPHILS 0.0 0.0 - 0.4 K/UL    ABS. BASOPHILS 0.0 0.0 - 0.1 K/UL    DF SMEAR SCANNED      RBC COMMENTS NORMOCYTIC, NORMOCHROMIC      WBC COMMENTS TOXIC GRANULATION     HEPATIC FUNCTION PANEL    Collection Time: 03/09/15 12:29 PM   Result Value Ref Range    Protein, total 6.2 (L) 6.4 - 8.2 g/dL    Albumin 2.6 (L) 3.5 - 5.0 g/dL    Globulin 3.6 2.0 - 4.0 g/dL    A-G Ratio 0.7 (L) 1.1 - 2.2      Bilirubin, total 0.2 0.2 - 1.0 MG/DL    Bilirubin, direct <0.1 0.0 - 0.2 MG/DL    Alk. phosphatase 424 (H) 45 - 117 U/L    AST 25 15 - 37 U/L    ALT 35 12 - 78 U/L   POC CHEM8    Collection Time: 03/09/15 12:31 PM   Result Value Ref Range    Calcium, ionized (POC) 1.06 (L) 1.12 - 1.32 MMOL/L    Sodium (POC) 136 136 - 145 MMOL/L    Potassium (POC) 4.1 3.5 - 5.1 MMOL/L    Chloride (POC) 104 98 - 107 MMOL/L    CO2 (POC) 24 21 - 32 MMOL/L    Anion gap (POC) 14 5 - 15 mmol/L    Glucose (POC) 91 65 - 105 MG/DL     BUN (POC) 15 9 - 20 MG/DL    Creatinine (POC) 0.3 (L) 0.6 - 1.3 MG/DL    GFR-AA (POC) >60 >60 ml/min/1.70m    GFR, non-AA (POC) >60 >60 ml/min/1.728m   Hemoglobin (POC) 12.9 11.5 - 16.0 GM/DL    Hematocrit (POC) 38 35.0 - 47.0 %    Comment Comment Not Indicated.     EKG, 12 LEAD, INITIAL    Collection Time: 03/09/15  1:41 PM   Result Value Ref Range    Ventricular  Rate 112 BPM    Atrial Rate 112 BPM    P-R Interval 118 ms    QRS Duration 84 ms    Q-T Interval 276 ms    QTC Calculation (Bezet) 376 ms    Calculated P Axis 17 degrees    Calculated R Axis 23 degrees    Calculated T Axis -46 degrees    Diagnosis       Sinus tachycardia  Nonspecific ST and T wave abnormality  Abnormal ECG  No previous ECGs available  Confirmed by Francis Gaines, M.D., [place]ta [place] [id number]) on 03/09/2015 2:57:02 PM     Abnormal lab results reported to Dr. Valinda Hoar office. Orders received to hold and re-try the Zometa in 1 month.     PIV flushed and removed per protocol.     1300  D/C'd from [facility] via wheelchair and in no distress accompanied by friends. Next appointment is Apr 25, 2015 at 1400.

## 2015-03-11 LAB — CA 27.29: CA 27.29: 255 U/mL — ABNORMAL HIGH (ref 0.0–38.6)

## 2015-03-23 ENCOUNTER — Ambulatory Visit: Admit: 2015-03-23 | Discharge: 2015-03-23 | Payer: PRIVATE HEALTH INSURANCE | Attending: Nurse Practitioner

## 2015-03-23 ENCOUNTER — Inpatient Hospital Stay: Admit: 2015-03-23 | Payer: BLUE CROSS/BLUE SHIELD

## 2015-03-23 DIAGNOSIS — C50911 Malignant neoplasm of unspecified site of right female breast: Secondary | ICD-10-CM

## 2015-03-23 LAB — CBC WITH AUTOMATED DIFF
ABS. BASOPHILS: 0 10*3/uL (ref 0.0–0.1)
ABS. EOSINOPHILS: 0 10*3/uL (ref 0.0–0.4)
ABS. LYMPHOCYTES: 0.2 10*3/uL — ABNORMAL LOW (ref 0.8–3.5)
ABS. MONOCYTES: 0.1 10*3/uL (ref 0.0–1.0)
ABS. NEUTROPHILS: 2.5 10*3/uL (ref 1.8–8.0)
BASOPHILS: 0 % (ref 0–1)
EOSINOPHILS: 0 % (ref 0–7)
HCT: 40.2 % (ref 35.0–47.0)
HGB: 13.1 g/dL (ref 11.5–16.0)
LYMPHOCYTES: 6 % — ABNORMAL LOW (ref 12–49)
MCH: 32 PG (ref 26.0–34.0)
MCHC: 32.6 g/dL (ref 30.0–36.5)
MCV: 98.3 FL (ref 80.0–99.0)
MONOCYTES: 4 % — ABNORMAL LOW (ref 5–13)
NEUTROPHILS: 90 % — ABNORMAL HIGH (ref 32–75)
PLATELET: 253 10*3/uL (ref 150–400)
RBC: 4.09 M/uL (ref 3.80–5.20)
RDW: 14.9 % — ABNORMAL HIGH (ref 11.5–14.5)
WBC: 2.8 10*3/uL — ABNORMAL LOW (ref 3.6–11.0)

## 2015-03-23 LAB — METABOLIC PANEL, COMPREHENSIVE
A-G Ratio: 0.8 — ABNORMAL LOW (ref 1.1–2.2)
ALT (SGPT): 39 U/L (ref 12–78)
AST (SGOT): 25 U/L (ref 15–37)
Albumin: 2.8 g/dL — ABNORMAL LOW (ref 3.5–5.0)
Alk. phosphatase: 251 U/L — ABNORMAL HIGH (ref 45–117)
Anion gap: 9 mmol/L (ref 5–15)
BUN/Creatinine ratio: 61 — ABNORMAL HIGH (ref 12–20)
BUN: 19 MG/DL (ref 6–20)
Bilirubin, total: 0.3 MG/DL (ref 0.2–1.0)
CO2: 25 mmol/L (ref 21–32)
Calcium: 8.6 MG/DL (ref 8.5–10.1)
Chloride: 104 mmol/L (ref 97–108)
Creatinine: 0.31 MG/DL — ABNORMAL LOW (ref 0.55–1.02)
GFR est AA: >60 mL/min/{1.73_m2} (ref >60)
GFR est non-AA: >60 mL/min/{1.73_m2} (ref >60)
Globulin: 3.7 g/dL (ref 2.0–4.0)
Glucose: 118 mg/dL — ABNORMAL HIGH (ref 65–100)
Potassium: 4.1 mmol/L (ref 3.5–5.1)
Protein, total: 6.5 g/dL (ref 6.4–8.2)
Sodium: 138 mmol/L (ref 136–145)

## 2015-03-23 MED ORDER — FOLIC ACID 1 MG TAB
1 mg | ORAL_TABLET | Freq: Every day | ORAL | 3 refills | Status: AC
Start: 2015-03-23 — End: ?

## 2015-03-23 MED ORDER — HYDROCHLOROTHIAZIDE 12.5 MG TAB
12.5 mg | ORAL_TABLET | Freq: Every day | ORAL | 3 refills | Status: AC
Start: 2015-03-23 — End: ?

## 2015-03-23 MED ORDER — OXYCODONE 5 MG TAB
5 mg | ORAL_TABLET | ORAL | 0 refills | Status: AC | PRN
Start: 2015-03-23 — End: ?

## 2015-03-23 MED ORDER — PANTOPRAZOLE 40 MG TAB, DELAYED RELEASE
40 mg | ORAL_TABLET | Freq: Every day | ORAL | 3 refills | Status: AC
Start: 2015-03-23 — End: ?

## 2015-03-23 NOTE — Progress Notes (Signed)
Kristin Stanley is a 54 y.o. female here for follow-up of breast cancer.

## 2015-03-23 NOTE — Patient Instructions (Addendum)
Come see us in 2 weeks.

## 2015-03-23 NOTE — Progress Notes (Signed)
Patient presents for lab draw ordered by:    Ordering Provider:  Michaele Offer  Ordering Department/Practice:  [facility]  Phone:    Date Ordered: 03/23/15    The following labs were drawn and sent to [place] lab at [facility] by Efraim Kaufmann:    CBC With Automated Diff, CMP    The following tubes were sent:    One purple and one PSST light green top tube

## 2015-03-23 NOTE — Progress Notes (Signed)
Please let her know her [place] is normal. Continue palbo. Thank you!

## 2015-03-23 NOTE — Progress Notes (Signed)
[facility]  [address], [place]   [address]  W: [phone]   F: [phone]      f/u HEME/ONC CONSULT      Reason for visit: evaluation for treatment for   Breast Cancer    Consulting physician:  Dr. Mike Gip in [place]    HPI:   Kristin Stanley is a 54 y.o.  female who I was asked to see in consultation at the request of Dr. Mike Gip for evaluation for systemic therapy for metastatic breast cancer.    She has a history of papillary thyroid cancer s/p partial thyroidectomy and 2 rounds or RIA, last in 1999.    She was found in July 2016 to have multiple bone lytic lesions throughout her axial skeleton with multiple areas of probably cord impingement.  She was found to have a right breast mass, admitted from 01/19/15-01/27/15. She has lost 95 lbs in 2 years.  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      CT guided biopsy of the right iliac wing on 01/21/15 showing metastatic breast cancer, ER + at > 90%, PR + at 51-90%, Her 2 Negative (IHC 2+, FISH  Negative, ratio 1.34; sig/cell 3.4 ). Thyroglobulin is negative.      She received 10 fx to her C-L spine from 01/21/15-02/07/15    MRI brain negative for brain lesions on 01/21/15    Palbociclib and Letrozole: 03/10/15  Zometa q3 months: 04/09/15-    Interval History: In today for follow up. Today complains gr 2 appetite, gr 1 fatigue, gr 2 hot flashes, gr 1 pain, gr 1 mouth sores, gr 1 swelling. In the middle of her palbociclib cycle today. Is taking dex 4 mg daily. Is taking oxycodone 5 mg daily for back pain with relief. She has noticed more hot flashes since starting the letrozole.     DX   Encounter Diagnosis   Name Primary?   ??? Cancer of right breast, stage 4 ([place]) Yes        Past Medical History   Diagnosis Date   ??? Cancer ([place])      thyroid   ??? GERD (gastroesophageal reflux disease)      Past Surgical History    Procedure Laterality Date   ??? Hx other surgical  1998     thyroid removed due to cancer     Social History     Social History   ??? Marital status: SINGLE     Spouse name: N/A   ??? Number of children: N/A   ??? Years of education: N/A     Social History Main Topics   ??? Smoking status: Never Smoker   ??? Smokeless tobacco: None   ??? Alcohol use No      Comment: Very rare   ??? Drug use: No   ??? Sexual activity: No     Other Topics Concern   ??? None     Social History Narrative     Family History   Problem Relation Age of Onset   ??? Cancer Father      lymphoma   ??? Hypertension Father        Current Outpatient Prescriptions   Medication Sig Dispense Refill   ??? folic acid (FOLVITE) 1 mg tablet Take 1 Tab by mouth daily. 90 Tab 3   ??? pantoprazole (PROTONIX) 40  mg tablet Take 1 Tab by mouth daily. 90 Tab 3   ??? oxyCODONE IR (ROXICODONE) 5 mg immediate release tablet Take 1 Tab by mouth every four (4) hours as needed. Max Daily Amount: 30 mg. 50 Tab 0   ??? hydrochlorothiazide (HYDRODIURIL) 12.5 mg tablet Take 1 Tab by mouth daily. 90 Tab 3   ??? palbociclib 125 mg cap Take 125 mg by mouth daily. Take daily for 21 days then 7 days off 21 Cap 6   ??? letrozole (FEMARA) 2.5 mg tablet Take 1 Tab by mouth daily. 90 Tab 3   ??? gabapentin (NEURONTIN) 100 mg capsule Take 100 mg by mouth three (3) times daily.     ??? dexamethasone (DECADRON) 4 mg tablet Take 4 mg by mouth two (2) times daily (with meals).     ??? nystatin (MYCOSTATIN) 100,000 unit/mL suspension Take 500,000 Units by mouth five (5) times daily.     ??? cholecalciferol (VITAMIN D3) 1,000 unit cap Take 3,000 Units by mouth daily.     ??? CALCIUM CARBONATE/VITAMIN D3 (CALCIUM 600 WITH VITAMIN D3 PO) Take 1 Tab by mouth daily.     ??? liothyronine (CYTOMEL) 25 mcg tablet Take 1 Tab by mouth daily. 90 Tab 3   ??? levothyroxine (SYNTHROID) 112 mcg tablet Take 1 Tab by mouth daily. 90 Tab 3   ??? ALPRAZolam (XANAX) 0.25 mg tablet Take 0.25 mg by mouth three (3) times daily as needed for Anxiety.          No Known Allergies    Review of Systems    A comprehensive review of systems was performed and all systems were negative except for HPI and for the symptom review form, reviewed and scanned in.    Objective:  Physical Exam:  Visit Vitals   ??? BP (!) 157/112   ??? Pulse (!) 109   ??? Temp 97.2 ??F (36.2 ??C) (Temporal)   ??? Resp 18   ??? Ht 5' 2" (1.575 m)   ??? Wt 110 lb (49.9 kg)   ??? SpO2 94%   ??? BMI 20.12 kg/m2       General:  Alert, cooperative, no distress, appears stated age. In a wheelchair   Head:  Normocephalic, without obvious abnormality, atraumatic.   Eyes:  Conjunctivae/corneas clear. PERRL, EOMs intact.   Throat: Lips, mucosa, and tongue normal.    Neck: Supple, symmetrical, trachea midline, no adenopathy, thyroid: no enlargement/tenderness/nodules   Back:   Symmetric, no curvature. ROM normal. No CVA tenderness.   Lungs:   Clear to auscultation bilaterally.   Chest wall:  No tenderness or deformity.   Heart:  Regular rate and rhythm, S1, S2 normal, no murmur, click, rub or gallop.   Breast Exam:  Right breast mass at 1:00, 3 cm from the nipple, 3.5 cm mass (last exam, deferred today)   Abdomen:   Soft, non-tender. Bowel sounds normal. No masses,  No organomegaly.   Extremities: Extremities normal, atraumatic, no cyanosis or edema.   Skin: Skin color, texture, turgor normal. No rashes or lesions.       Neurologic: CNII-XII intact.; 5/5 LE strength       Diagnostic Imaging   No results found for this or any previous visit.    No results found for this or any previous visit.    Lab Results  Lab Results   Component Value Date/Time    WBC 2.8 03/23/2015 09:44 AM    HGB 13.1 03/23/2015 09:44 AM    HCT  40.2 03/23/2015 09:44 AM    PLATELET 253 03/23/2015 09:44 AM    MCV 98.3 03/23/2015 09:44 AM       Lab Results   Component Value Date/Time    SODIUM 138 03/23/2015 09:44 AM    POTASSIUM 4.1 03/23/2015 09:44 AM    CHLORIDE 104 03/23/2015 09:44 AM    CO2 25 03/23/2015 09:44 AM    ANION GAP 9 03/23/2015 09:44 AM     GLUCOSE 118 03/23/2015 09:44 AM    BUN 19 03/23/2015 09:44 AM    CREATININE 0.31 03/23/2015 09:44 AM    BUN/CREATININE RATIO 61 03/23/2015 09:44 AM    GFR EST AA >60 03/23/2015 09:44 AM    GFR EST NON-AA >60 03/23/2015 09:44 AM    CALCIUM 8.6 03/23/2015 09:44 AM    ALT PENDING 03/23/2015 09:44 AM    AST PENDING 03/23/2015 09:44 AM    ALK. PHOSPHATASE PENDING 03/23/2015 09:44 AM    PROTEIN, TOTAL PENDING 03/23/2015 09:44 AM    ALBUMIN PENDING 03/23/2015 09:44 AM    GLOBULIN PENDING 03/23/2015 09:44 AM    A-G RATIO PENDING 03/23/2015 09:44 AM       .    Assessment/Plan:  54 y.o. female with right breast cancer, ER +, PR +, HER 2 negative.  PS 0    1. 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    We discussed that the avg life expectancy with metastatic breast cancer is 2 years.    Her 2 negative.     The risks and benefits of aromatase inhibitors (anastrozole, letrozole, and exemestane) were discussed in detail and the patient was informed of the following: Risks include the development of painful muscles and joints (arthralgia/myalgia) and bone loss. Muscle and joint pain can be severe but rarely result in any tissue damage; symptoms usually resolve in several weeks when the medication is stopped. Bone loss is common and a bone density test is recommended as a baseline and then yearly to every several years depending on initial results. The risk of fractures is increased by a few percent in patients taking these drugs, but careful monitoring of bone density and using bone protecting agents when indicated can minimize these risks. Unlike tamoxifen there is no increased risk of blood clots or endometrial cancer. AIs can cause or worsen vaginal dryness but women using these drugs should not use vaginal estrogen preparations for these symptoms. AIs can also cause or increase hot flashes. Any other  symptoms should be reported.  ??  She is agreeable to letrozole 2.5 mg daily. rx in. Also discussed palbociclib 125 mg daily for days 1-21 of 28 day cycle.Prescriptions for emla cream, compazine, zofran, and doxycycline.    Discussed side effect including but not limited to myelosuppression, fatigue, long QTc, mouth sores, nausea, diarrhea. She is agreeable to this, rx in.  EKG normal on 03/09/15.   Advised to not take take any grapefruit and grapefruit juice.    Zometa to start at next visit. We discussed side effects such as ONJ and the need to avoid invasive dental procedures while on these medications, renal damage, and hypocalcemia.    Will get CBC and CMP monthly. Pill diary given today.    Will see her back in 2 weeks.     2. Emotional well being:  She has excellent support and is coping  well with her diseasae    3. S/p cord compression:  Dexamethasone 4 mg daily, but sometimes she will take it BID.     4. 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 History of thyroid cancer: Refilled her liothyronine and levothyroxine previously    6. Elevated bp: elevated again today, will start hydrochlorothiazide 12.5 mg, rx today.     7. Hypocalcemia:  Improved today, held zometa in Aug, will retry in 2 weeks    Thank you for this consult.  All of the patient's questions were answered today.        Patient Instructions   Come see [place] in 2 weeks.      Follow-up Disposition:  Return in about 2 weeks (around 2015/05/03) for labs, sand/Delainee Tramel, opic zometa.    Sonda Rumble, MD

## 2015-03-24 NOTE — Progress Notes (Signed)
Return call from pt, ID verified x2. Results reviewed. Pt expressed understanding with no further questions or concerns.

## 2015-03-24 NOTE — Progress Notes (Signed)
Call to pt, left message on voicemail to return call to office re: lab results

## 2015-03-24 NOTE — Telephone Encounter (Signed)
Patient left VM stating that she was returning [name]'s call about her lab results. She also stated that she thought Dr. Laurena Bering was going to put her on some blood pressure medicine, but that was not called in to her pharmacy. Aubriella's CB# is [phone].

## 2015-03-24 NOTE — Telephone Encounter (Signed)
Please refer to result note dated 03/24/15

## 2015-04-01 MED ORDER — ZOLEDRONIC ACID 4 MG/100 ML IN MANNITOL & WATER IV
4 mg/100 mL | Freq: Once | INTRAVENOUS | Status: DC
Start: 2015-04-01 — End: 2015-04-06

## 2015-04-06 ENCOUNTER — Inpatient Hospital Stay: Payer: BLUE CROSS/BLUE SHIELD

## 2015-04-06 ENCOUNTER — Ambulatory Visit: Attending: Nurse Practitioner

## 2015-04-06 NOTE — Progress Notes (Signed)
error 

## 2015-04-09 DEATH — deceased

## 2016-01-09 IMAGING — MR MR HEAD WO/W CM
10 of 13 series · 36 of 48 positions shown · IV contrast (multihance)
Comparison: CT of the chest and abdomen 01/19/2015

CLINICAL DATA: Headaches and dizziness. Left-sided weakness. Remote
history of thyroid cancer. Diffuse skeletal abnormalities with lytic
lesions.

EXAM:
MRI HEAD WITHOUT AND WITH CONTRAST
TECHNIQUE: Multiplanar, multiecho pulse sequences of the brain and surrounding
structures were obtained without and with intravenous contrast.
CONTRAST:  8mL MULTIHANCE GADOBENATE DIMEGLUMINE 529 MG/ML IV SOLN

[Series 4: DWI · axial · 4.0mm · 0.94mm/px · z∈[-44,+122]mm · 4 of 44 slices shown (1 of 4)]
[im 1/44]
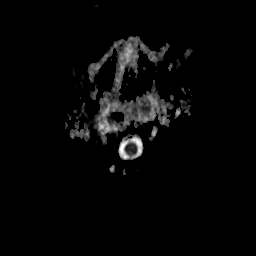
[im 15/44]
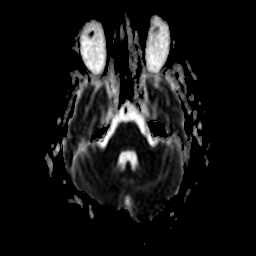
[im 29/44]
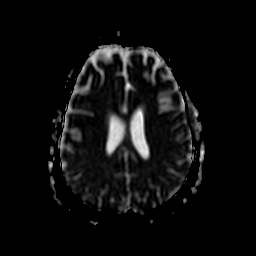
[im 44/44]
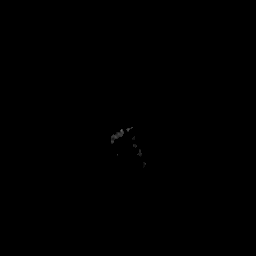

[Series 5: DWI · axial · 4.0mm · 0.94mm/px · z∈[-44,+122]mm · 4 of 44 slices shown (2 of 4)]
[im 1/44]
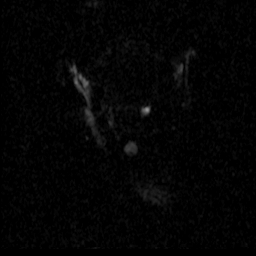
[im 15/44]
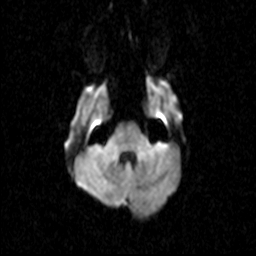
[im 29/44]
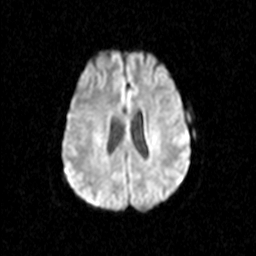
[im 44/44]
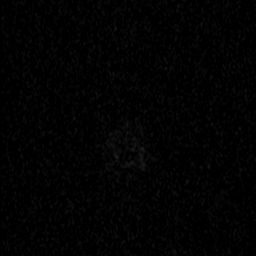

[Series 7: DWI · coronal · 5.0mm · 1.80mm/px · 4 of 41 slices shown (3 of 4)]
[im 1/41]
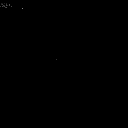
[im 14/41]
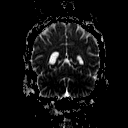
[im 27/41]
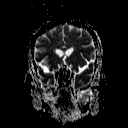
[im 41/41]
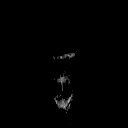

[Series 8: DWI · coronal · 5.0mm · 1.80mm/px · 4 of 39 slices shown (4 of 4)]
[im 1/39]
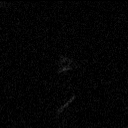
[im 13/39]
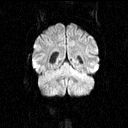
[im 26/39]
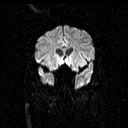
[im 39/39]
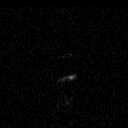

[Series 9: T2 · axial · 5.0mm · 0.45mm/px · z∈[-42,+121]mm · 3 of 27 slices shown (1 of 2)]
[im 1/27]
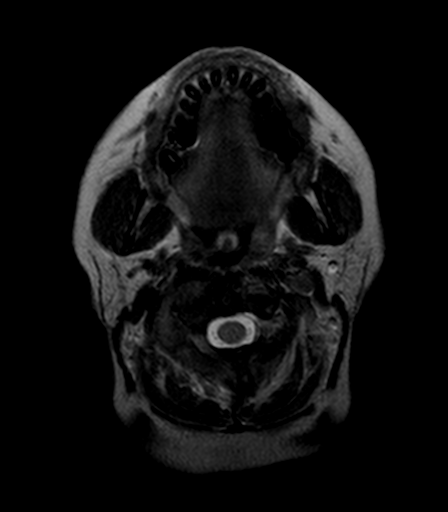
[im 14/27]
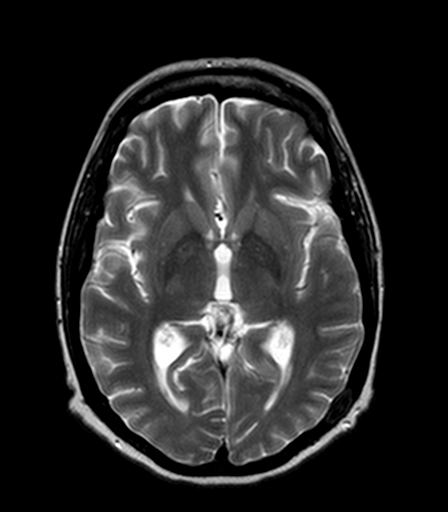
[im 27/27]
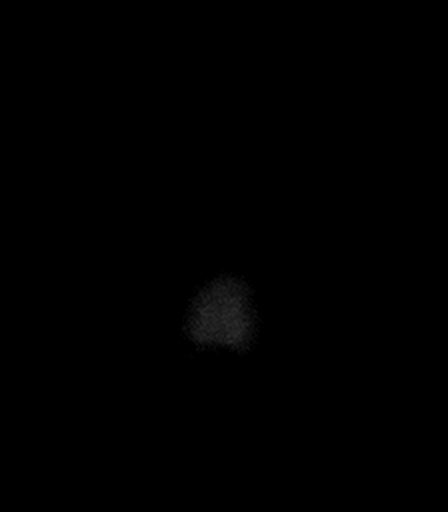

[Series 10: FLAIR · axial · 5.0mm · 0.90mm/px · z∈[-42,+121]mm · 3 of 27 slices shown]
[im 1/27]
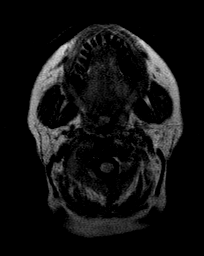
[im 14/27]
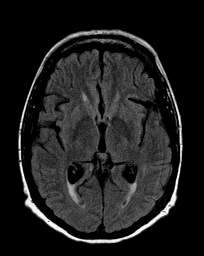
[im 27/27]
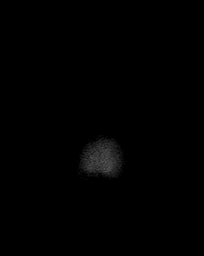

[Series 11: T2 · axial · 5.0mm · 0.45mm/px · z∈[-42,+40]mm · 2 of 27 slices shown (2 of 2)]
[im 1/27]
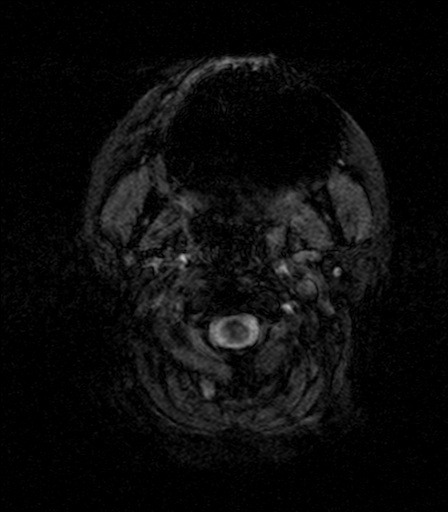
[im 14/27]
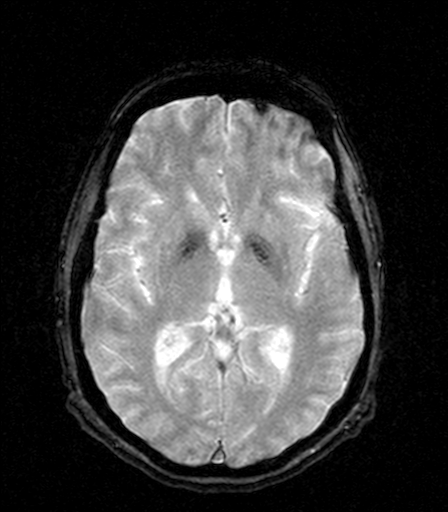

[Series 14: T1 post-contrast · axial · 3.0mm · 0.45mm/px · z∈[-52,+131]mm · 6 of 64 slices shown (1 of 3)]
[im 1/64]
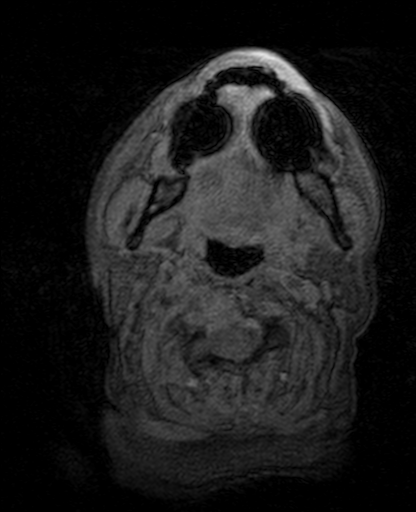
[im 13/64]
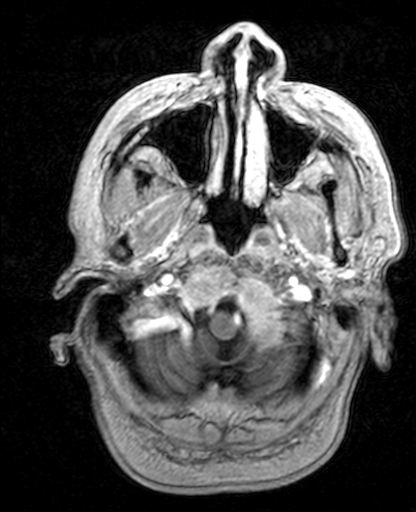
[im 26/64]
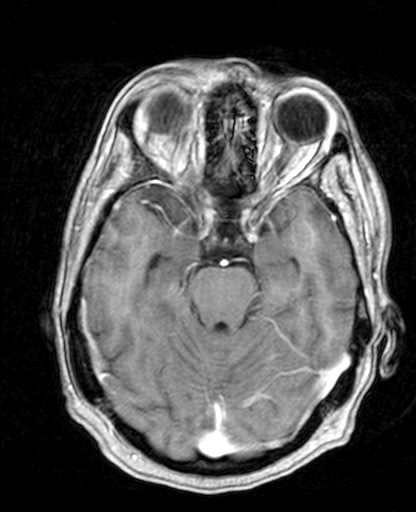
[im 38/64]
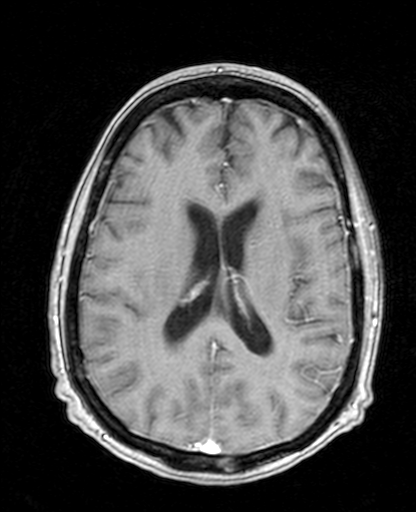
[im 51/64]
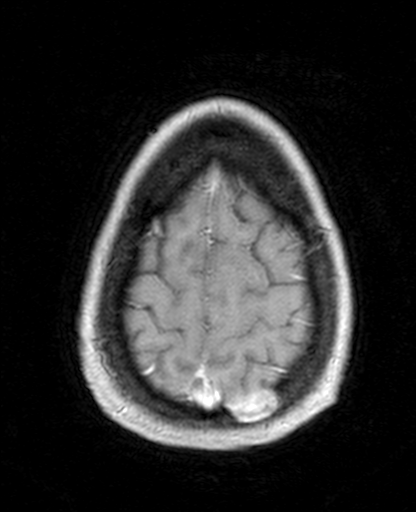
[im 64/64]
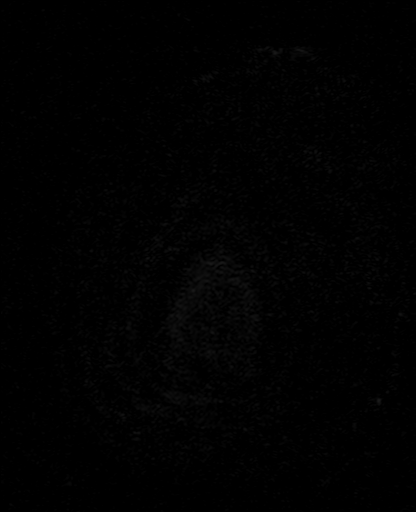

[Series 15: T1 post-contrast · coronal · 5.0mm · 0.45mm/px · 3 of 29 slices shown (2 of 3)]
[im 1/29]
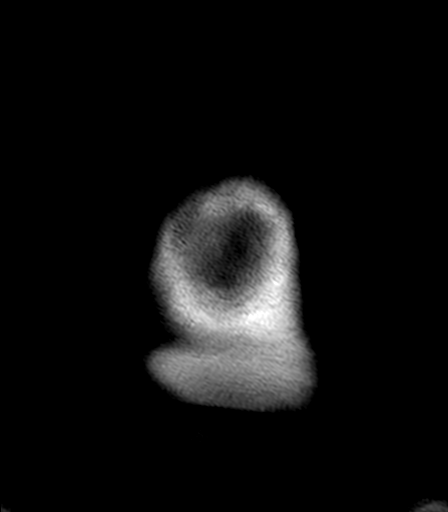
[im 15/29]
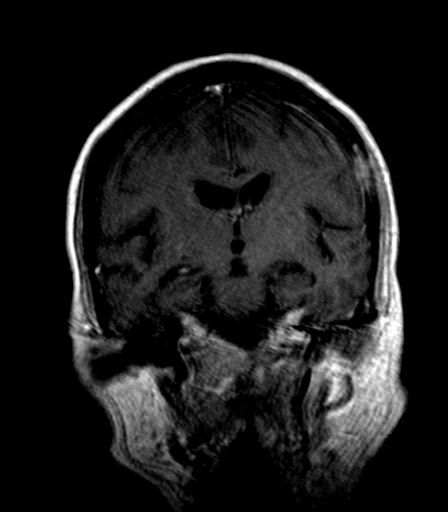
[im 29/29]
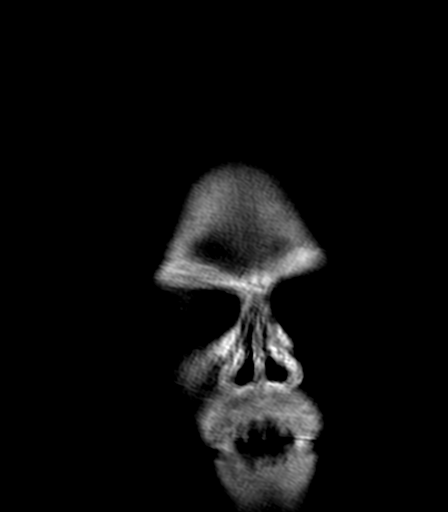

[Series 16: T1 post-contrast · sagittal · 5.0mm · 0.45mm/px · 3 of 27 slices shown (3 of 3)]
[im 1/27]
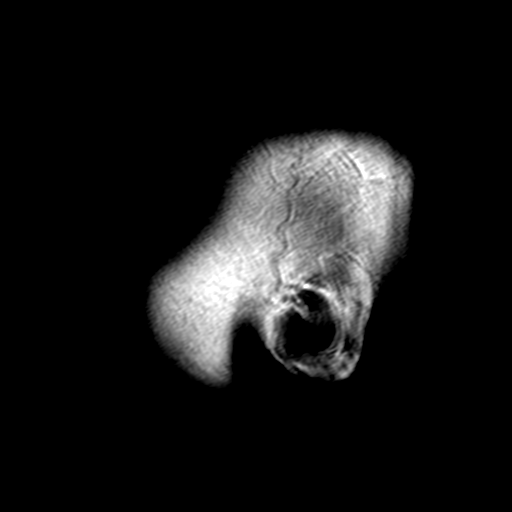
[im 14/27]
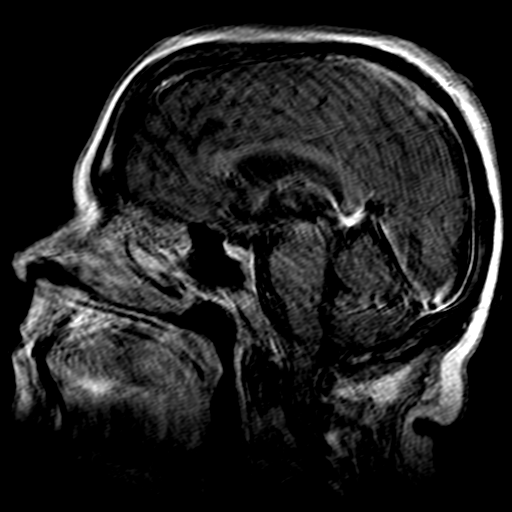
[im 27/27]
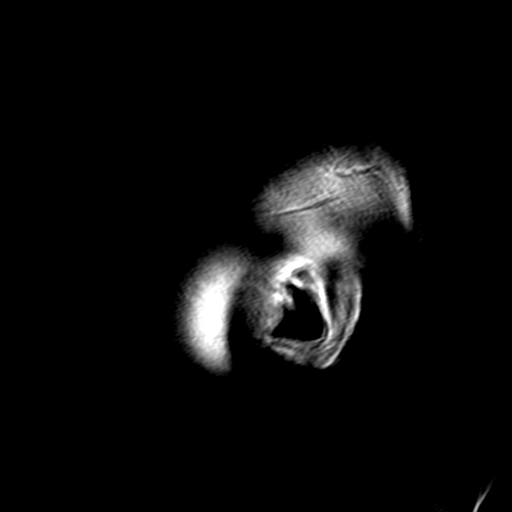

[36 of 48 positions shown; findings below may reference images not displayed]

FINDINGS: Mild periventricular white matter changes are somewhat advanced for
age. No acute infarct, hemorrhage, or mass lesion is present.

Flow is present in the major intracranial arteries. The globes and
orbits are intact. The paranasal sinuses are clear. Minimal fluid is
present in the right mastoid air cells. No obstructing
nasopharyngeal lesion is present.

Postcontrast images demonstrate no pathologic enhancement of the
brain. Multiple skull lesions are noted. A high a posterior left
parietal lesion measures 1.6 x 2.3 x 1.9 cm. Multiple other smaller
lesions are present without mass effect.

A lesion in the clivus extends to the skullbase bilaterally, right
greater than left. This measured 15 x 31 mm on sagittal images just
to the right of midline. There is confluent osseous signal
abnormality at C2 and C3. This results in at least moderate central
canal stenosis at the C2-3 level.
IMPRESSION: 1. Mild age advanced atrophy without evidence for metastatic disease
to the brain.
2. Multiple enhancing lesions of the calvarium compatible with
multiple myeloma or metastatic disease.
3. Marked signal abnormality and enhancement at the inferior clivus
and extending through the upper cervical spine with moderate to
severe central canal stenosis at C2-3. This may result in the
patient's symptoms.
